# Patient Record
Sex: Female | Born: 1990 | Race: Black or African American | Hispanic: No | Marital: Single | Smoking: Never smoker
Health system: Southern US, Community
[De-identification: ages and names within clinical notes are randomized; demographics above are authoritative.]

## PROBLEM LIST (undated history)

## (undated) ENCOUNTER — Inpatient Hospital Stay: Payer: Self-pay

## (undated) DIAGNOSIS — O09299 Supervision of pregnancy with other poor reproductive or obstetric history, unspecified trimester: Secondary | ICD-10-CM

## (undated) HISTORY — PX: TONSILLECTOMY: SUR1361

## (undated) HISTORY — DX: Supervision of pregnancy with other poor reproductive or obstetric history, unspecified trimester: O09.299

---

## 2015-06-22 NOTE — L&D Delivery Note (Signed)
Deliver Note   Date of Delivery:   12/17/2015 Primary OB:   [facility] Gestational Age/EDD: 8152w5d by 01/09/2016, by Last Menstrual Period  Antepartum complications:  OB History    Gravida Para Term Preterm AB TAB SAB Ectopic Multiple Living   1               Delivered By:   Vena AustriaStaebler, Darrio Bade MD  Delivery Type:   Vaginal Anesthesia:     Epidural  Intrapartum complications:  GBS:    Unknown Laceration:    none Episiotomy:    none Placenta:    Spontaneous Estimated Blood Loss:  200mL Baby:    Non-viable  female , APGAR (1 MIN): N/A This patient has no babies on file.  APGAR (5 MINS): This patient has no babies on file.  APGAR (10 MINS): This patient has no babies on file. , weight pending   Deliver Details   At  non-viable  female was delivered via  vaginal delivery.  APGAR:N/A weight pending  Placenta status: spontaneous, intact.  Cord: 3VC, moderate amount of edematous changes to cord, with the following complications: IUFD .  Phenotypically normal appearing 3rd trimester female IUFD, some desquamation present, there was a moderate amount of edematous changes noted in the fetal cord but no knots or nuchals noted.      Mom to postpartum.  [facility].

## 2015-12-16 ENCOUNTER — Inpatient Hospital Stay
Admission: EM | Admit: 2015-12-16 | Discharge: 2015-12-18 | DRG: 775 | Disposition: A | Discharge status: Home / Self Care | Payer: 59 | Attending: Obstetrics & Gynecology | Admitting: Obstetrics & Gynecology

## 2015-12-16 DIAGNOSIS — O364XX0 Maternal care for intrauterine death, not applicable or unspecified: Principal | ICD-10-CM | POA: Diagnosis present

## 2015-12-16 DIAGNOSIS — Z3A36 36 weeks gestation of pregnancy: Secondary | ICD-10-CM | POA: Diagnosis not present

## 2015-12-16 DIAGNOSIS — IMO0002 Reserved for concepts with insufficient information to code with codable children: Secondary | ICD-10-CM | POA: Diagnosis present

## 2015-12-16 MED ORDER — ACETAMINOPHEN 325 MG PO TABS
650.0000 mg | ORAL_TABLET | ORAL | Status: DC | PRN
  Administered 2015-12-17 (×2): 650 mg via ORAL
  Filled 2015-12-16 (×2): qty 2

## 2015-12-16 MED ORDER — MISOPROSTOL 200 MCG PO TABS
200.0000 ug | ORAL_TABLET | ORAL | Status: DC
  Administered 2015-12-17: 200 ug via VAGINAL
  Filled 2015-12-16 (×3): qty 1

## 2015-12-16 MED ORDER — BUTORPHANOL TARTRATE 1 MG/ML IJ SOLN
1.0000 mg | INTRAMUSCULAR | Status: DC | PRN
  Administered 2015-12-17 (×2): 1 mg via INTRAVENOUS
  Filled 2015-12-16 (×2): qty 1

## 2015-12-16 MED ORDER — LACTATED RINGERS IV SOLN: 500.0000 mL | INTRAVENOUS | Status: DC | PRN

## 2015-12-16 MED ORDER — LACTATED RINGERS IV SOLN
INTRAVENOUS | Status: DC
  Administered 2015-12-17: 125 mL/h via INTRAVENOUS
  Administered 2015-12-17: via INTRAVENOUS
  Administered 2015-12-17: 125 mL/h via INTRAVENOUS

## 2015-12-16 MED ORDER — ONDANSETRON HCL 4 MG/2ML IJ SOLN: 4.0000 mg | Freq: Four times a day (QID) | INTRAMUSCULAR | Status: DC | PRN

## 2015-12-16 MED ORDER — OXYTOCIN BOLUS FROM INFUSION: 500.0000 mL | INTRAVENOUS | Status: DC

## 2015-12-16 MED ORDER — OXYTOCIN 40 UNITS IN LACTATED RINGERS INFUSION - SIMPLE MED
2.5000 [IU]/h | INTRAVENOUS | Status: DC
  Filled 2015-12-16 (×2): qty 1000

## 2015-12-16 NOTE — H&P (Signed)
Obstetrics Admission History & Physical   CC: Fetal demise   HPI:  25 y.o. G1P0 @ 854w4d (01/09/2016, by Last Menstrual Period). Admitted on 12/16/2015:   Presents for office visit, no FHTs.  US confirms no FHTs.  No pain, spotting recently, no other sx's, even + FM today.  [facility]  PMHx:  Past Medical History  Diagnosis Date  . Medical history non-contributory    PSHx:  Past Surgical History  Procedure Laterality Date  . Tonsillectomy     Medications:  No prescriptions prior to admission   Allergies: has No Known Allergies. OBHx:  OB History  Gravida Para Term Preterm AB SAB TAB Ectopic Multiple Living  1             # Outcome Date GA Lbr Len/2nd Weight Sex Delivery Anes PTL Lv  1 Current              XBJ:YNWGNFAO/ZHYQMVHQIONGFHx:Negative/unremarkable except as detailed in HPI. Soc Hx: Pregnancy welcomed  Objective:   Filed Vitals:   12/16/15 2100  BP: 130/70  Pulse: 100  Temp: 98.6 F (37 C)  Resp: 18   General: Well nourished, well developed female in no acute distress.  Skin: Warm and dry.  Cardiovascular:Regular rate and rhythm. Respiratory: Clear to auscultation bilateral. Normal respiratory effort Abdomen: mild Neuro/Psych: Normal mood and affect.   Pelvic exam: is not limited by body habitus EGBUS: within normal limits Vagina: within normal limits and with normal mucosa blood in the vault Cervix: closed exam Uterus: No contractions observed for 30 minutes.  Adnexa: not evaluated  EFM:FHR: None  Toco: None US at bedside: Vertex, No FHTs, Ant placenta, Adequate amniotic fluid   Assessment & Plan:   25 y.o. G1P0 @ 584w4d, Admitted on 12/16/2015:Fetal demise Admit for Induction Cytotec Pain meds Couinseled as to exam and labs and other investigations that can be done, also that many times no answer for what led to this can be found. Future fertility discussed.

## 2015-12-17 ENCOUNTER — Inpatient Hospital Stay: Payer: 59 | Admitting: Anesthesiology

## 2015-12-17 ENCOUNTER — Inpatient Hospital Stay: Payer: 59

## 2015-12-17 ENCOUNTER — Encounter: Payer: Self-pay | Admitting: Anesthesiology

## 2015-12-17 LAB — CBC WITH DIFFERENTIAL/PLATELET
BASOS ABS: 0 10*3/uL (ref 0–0.1)
Basophils Relative: 0 %
Eosinophils Absolute: 0 10*3/uL (ref 0–0.7)
Eosinophils Relative: 0 %
HEMATOCRIT: 30.8 % — AB (ref 35.0–47.0)
Hemoglobin: 10.7 g/dL — ABNORMAL LOW (ref 12.0–16.0)
Lymphs Abs: 1.1 10*3/uL (ref 1.0–3.6)
MCH: 29.7 pg (ref 26.0–34.0)
MCHC: 34.8 g/dL (ref 32.0–36.0)
MCV: 85.3 fL (ref 80.0–100.0)
Monocytes Absolute: 1.3 10*3/uL — ABNORMAL HIGH (ref 0.2–0.9)
Monocytes Relative: 13 %
NEUTROS ABS: 7.8 10*3/uL — AB (ref 1.4–6.5)
Neutrophils Relative %: 76 %
Platelets: 190 10*3/uL (ref 150–440)
RBC: 3.61 MIL/uL — AB (ref 3.80–5.20)
RDW: 13.3 % (ref 11.5–14.5)
WBC: 10.2 10*3/uL (ref 3.6–11.0)

## 2015-12-17 LAB — APTT
APTT: 32 s (ref 24–36)
aPTT: 32 seconds (ref 24–36)

## 2015-12-17 LAB — CBC
HEMATOCRIT: 31.2 % — AB (ref 35.0–47.0)
HEMOGLOBIN: 11.2 g/dL — AB (ref 12.0–16.0)
MCH: 30.4 pg (ref 26.0–34.0)
MCHC: 35.8 g/dL (ref 32.0–36.0)
MCV: 84.7 fL (ref 80.0–100.0)
PLATELETS: 208 10*3/uL (ref 150–440)
RBC: 3.69 MIL/uL — AB (ref 3.80–5.20)
RDW: 13.7 % (ref 11.5–14.5)
WBC: 10 10*3/uL (ref 3.6–11.0)

## 2015-12-17 LAB — PROTIME-INR
INR: 1.01
INR: 1.07
Prothrombin Time: 13.5 seconds (ref 11.4–15.0)
Prothrombin Time: 14.1 seconds (ref 11.4–15.0)

## 2015-12-17 LAB — KLEIHAUER-BETKE STAIN
Fetal Cells %: 4.94 %
Quantitation Fetal Hemoglobin: 0.0494 mL

## 2015-12-17 LAB — TYPE AND SCREEN
ABO/RH(D): O POS
Antibody Screen: NEGATIVE

## 2015-12-17 LAB — FIBRINOGEN: FIBRINOGEN: 484 mg/dL — AB (ref 210–470)

## 2015-12-17 LAB — ANTITHROMBIN III: ANTITHROMB III FUNC: 91 % (ref 75–120)

## 2015-12-17 MED ORDER — DIPHENHYDRAMINE HCL 50 MG/ML IJ SOLN: 12.5000 mg | INTRAMUSCULAR | Status: DC | PRN

## 2015-12-17 MED ORDER — MISOPROSTOL 25 MCG QUARTER TABLET: 25.0000 ug | ORAL_TABLET | ORAL | Status: DC

## 2015-12-17 MED ORDER — LIDOCAINE HCL (PF) 1 % IJ SOLN
INTRAMUSCULAR | Status: DC | PRN
  Administered 2015-12-17: 3 mL

## 2015-12-17 MED ORDER — BUPIVACAINE HCL (PF) 0.25 % IJ SOLN
INTRAMUSCULAR | Status: DC | PRN
  Administered 2015-12-17 (×2): 3 mL via EPIDURAL

## 2015-12-17 MED ORDER — DIPHENHYDRAMINE HCL 25 MG PO CAPS: 25.0000 mg | ORAL_CAPSULE | Freq: Four times a day (QID) | ORAL | Status: DC | PRN

## 2015-12-17 MED ORDER — ONDANSETRON HCL 4 MG PO TABS: 4.0000 mg | ORAL_TABLET | ORAL | Status: DC | PRN

## 2015-12-17 MED ORDER — PHENYLEPHRINE 40 MCG/ML (10ML) SYRINGE FOR IV PUSH (FOR BLOOD PRESSURE SUPPORT): 80.0000 ug | PREFILLED_SYRINGE | INTRAVENOUS | Status: DC | PRN

## 2015-12-17 MED ORDER — ACETAMINOPHEN 325 MG PO TABS: 650.0000 mg | ORAL_TABLET | ORAL | Status: DC | PRN

## 2015-12-17 MED ORDER — FENTANYL 2.5 MCG/ML W/ROPIVACAINE 0.2% IN NS 100 ML EPIDURAL INFUSION (ARMC-ANES)
EPIDURAL | Status: AC
  Administered 2015-12-17: 10 mL/h via EPIDURAL
  Filled 2015-12-17: qty 100

## 2015-12-17 MED ORDER — TERBUTALINE SULFATE 1 MG/ML IJ SOLN
0.2500 mg | Freq: Once | INTRAMUSCULAR | Status: AC
  Administered 2015-12-17: 0.25 mg via SUBCUTANEOUS

## 2015-12-17 MED ORDER — COCONUT OIL OIL
1.0000 "application " | TOPICAL_OIL | Status: DC | PRN
  Filled 2015-12-17: qty 120

## 2015-12-17 MED ORDER — DIBUCAINE 1 % RE OINT: 1.0000 "application " | TOPICAL_OINTMENT | RECTAL | Status: DC | PRN

## 2015-12-17 MED ORDER — SODIUM CHLORIDE 0.9 % IJ SOLN
INTRAMUSCULAR | Status: AC
  Filled 2015-12-17: qty 50

## 2015-12-17 MED ORDER — ONDANSETRON HCL 4 MG/2ML IJ SOLN: 4.0000 mg | INTRAMUSCULAR | Status: DC | PRN

## 2015-12-17 MED ORDER — SENNOSIDES-DOCUSATE SODIUM 8.6-50 MG PO TABS: 2.0000 | ORAL_TABLET | ORAL | Status: DC

## 2015-12-17 MED ORDER — EPHEDRINE 5 MG/ML INJ: 10.0000 mg | INTRAVENOUS | Status: DC | PRN

## 2015-12-17 MED ORDER — LACTATED RINGERS IV SOLN: 500.0000 mL | Freq: Once | INTRAVENOUS | Status: DC

## 2015-12-17 MED ORDER — WITCH HAZEL-GLYCERIN EX PADS: 1.0000 "application " | MEDICATED_PAD | CUTANEOUS | Status: DC | PRN

## 2015-12-17 MED ORDER — FENTANYL 2.5 MCG/ML W/ROPIVACAINE 0.2% IN NS 100 ML EPIDURAL INFUSION (ARMC-ANES)
EPIDURAL | Status: AC
  Filled 2015-12-17: qty 100

## 2015-12-17 MED ORDER — PRENATAL MULTIVITAMIN CH: 1.0000 | ORAL_TABLET | Freq: Every day | ORAL | Status: DC

## 2015-12-17 MED ORDER — BENZOCAINE-MENTHOL 20-0.5 % EX AERO: 1.0000 "application " | INHALATION_SPRAY | CUTANEOUS | Status: DC | PRN

## 2015-12-17 MED ORDER — FENTANYL 2.5 MCG/ML W/ROPIVACAINE 0.2% IN NS 100 ML EPIDURAL INFUSION (ARMC-ANES): 10.0000 mL/h | EPIDURAL | Status: DC

## 2015-12-17 MED ORDER — TERBUTALINE SULFATE 1 MG/ML IJ SOLN
INTRAMUSCULAR | Status: AC
  Administered 2015-12-17: 0.25 mg via SUBCUTANEOUS
  Filled 2015-12-17: qty 1

## 2015-12-17 MED ORDER — LIDOCAINE-EPINEPHRINE (PF) 1.5 %-1:200000 IJ SOLN
INTRAMUSCULAR | Status: DC | PRN
  Administered 2015-12-17: 3 mL via PERINEURAL

## 2015-12-17 MED ORDER — OXYCODONE-ACETAMINOPHEN 5-325 MG PO TABS: 1.0000 | ORAL_TABLET | ORAL | Status: DC | PRN

## 2015-12-17 MED ORDER — IBUPROFEN 600 MG PO TABS
600.0000 mg | ORAL_TABLET | Freq: Four times a day (QID) | ORAL | Status: DC
  Administered 2015-12-17 – 2015-12-18 (×3): 600 mg via ORAL
  Filled 2015-12-17 (×3): qty 1

## 2015-12-17 MED ORDER — MISOPROSTOL 25 MCG QUARTER TABLET
ORAL_TABLET | ORAL | Status: AC
  Filled 2015-12-17: qty 0.25

## 2015-12-17 MED ORDER — SIMETHICONE 80 MG PO CHEW: 80.0000 mg | CHEWABLE_TABLET | ORAL | Status: DC | PRN

## 2015-12-17 MED ORDER — OXYCODONE-ACETAMINOPHEN 5-325 MG PO TABS: 2.0000 | ORAL_TABLET | ORAL | Status: DC | PRN

## 2015-12-17 MED ORDER — MISOPROSTOL 200 MCG PO TABS
ORAL_TABLET | ORAL | Status: AC
  Filled 2015-12-17: qty 1

## 2015-12-17 NOTE — Progress Notes (Signed)
[facility] offered and advised available if requested. Patient's Renato Gailsastor ministering to patient.

## 2015-12-17 NOTE — Anesthesia Preprocedure Evaluation (Addendum)
Anesthesia Evaluation  Patient identified by MRN, date of birth, ID band Patient awake    Reviewed: Allergy & Precautions, NPO status , Patient's Chart, lab work & pertinent test results, reviewed documented beta blocker date and time   History of Anesthesia Complications Negative for: history of anesthetic complications  Airway Mallampati: II  TM Distance: >3 FB Neck ROM: full    Dental no notable dental hx. (+) Chipped   Pulmonary neg pulmonary ROS,    Pulmonary exam normal        Cardiovascular negative cardio ROS Normal cardiovascular exam     Neuro/Psych negative neurological ROS  negative psych ROS   GI/Hepatic negative GI ROS, Neg liver ROS,   Endo/Other  negative endocrine ROS  Renal/GU negative Renal ROS  negative genitourinary   Musculoskeletal   Abdominal   Peds  Hematology negative hematology ROS (+)   Anesthesia Other Findings   Reproductive/Obstetrics (+) Pregnancy                            Anesthesia Physical Anesthesia Plan  ASA: II  Anesthesia Plan: Epidural   Post-op Pain Management:    Induction:   Airway Management Planned:   Additional Equipment:   Intra-op Plan:   Post-operative Plan:   Informed Consent: I have reviewed the patients History and Physical, chart, labs and discussed the procedure including the risks, benefits and alternatives for the proposed anesthesia with the patient or authorized representative who has indicated his/her understanding and acceptance.     Plan Discussed with: CRNA and Anesthesiologist  Anesthesia Plan Comments:        Anesthesia Quick Evaluation

## 2015-12-17 NOTE — Progress Notes (Signed)
Pt. Ready to discuss POC.  Pt. Moved to LDR#6 for privacy - away from laboring mothers. [facility].  Held 0900 dose of cyctotec, pt. in pain and requesting epidural.

## 2015-12-17 NOTE — Progress Notes (Signed)
Received update from RN regarding pt's temperature of 103.0 F. Dose of cytotec held due to tachysystole. Tylenol 650 mg ordered. RN states pt is feeling comfortable and not feverish.   Tresea MallGLEDHILL,Lauralie Blacksher, CNM

## 2015-12-17 NOTE — Progress Notes (Signed)
Pt is complete and feeling pressure to push. RN will begin pushing with her. Specimen collection tools ready for delivery for Karyotyping and infectious processes.  Tresea MallGledhill, Jerrian Mells, CNM

## 2015-12-17 NOTE — Anesthesia Procedure Notes (Signed)
Epidural Patient location during procedure: floor Start time: 12/17/2015 9:40 AM End time: 12/17/2015 10:17 AM  Staffing Resident/CRNA: Dionne Rossa Performed by: resident/CRNA   Preanesthetic Checklist Completed: patient identified, site marked, surgical consent, pre-op evaluation, timeout performed, IV checked, risks and benefits discussed and monitors and equipment checked  Epidural Patient position: sitting Prep: Betadine Patient monitoring: heart rate, continuous pulse ox and blood pressure Approach: midline Location: L3-L4 Injection technique: LOR air  Needle:  Needle type: Tuohy  Needle gauge: 17 G Needle length: 9 cm and 9 Catheter type: closed end flexible Catheter size: 20 Guage Test dose: negative and 1.5% lidocaine with Epi 1:200 K  Assessment Sensory level: T10 Events: blood not aspirated, injection not painful, no injection resistance, negative IV test and no paresthesia  Additional Notes X 3 sticks.  Possible dural puncture with negative test.  Patients VSS.  Epi cath placed above possible dural puncture.  Easy placement of catheter.  Fent/ropi drip placed and set to 2710ml/hr.  Pt comfortable. No complaints  Patient tolerated the insertion well without complications.Reason for block:procedure for pain

## 2015-12-17 NOTE — Progress Notes (Signed)
RN to the bedside to express condolences and discuss POC.  Pt. Alert and verbalizing desire for pain medication. Pt. Encouraged to call nurse when ready to proceed with induction.  Pt. Still processing situation (36.5 IUFD) and will call out when ready to proceed. VSS.

## 2015-12-17 NOTE — Progress Notes (Signed)
Subjective:  Doing well no concerns  Objective:   Vitals: Blood pressure 104/56, pulse 99, temperature 100.1 F (37.8 C), temperature source Oral, resp. rate 18, height 5\' 5"  (1.651 m), weight 77.565 kg (171 lb), last menstrual period 04/04/2015. General:  Abdomen: Cervical Exam:  Dilation: 4 Effacement (%): 70 Cervical Position: Anterior Station: -2 Exam by:: Millner, RN  FHT:  N/A Toco: q621min  Results for orders placed or performed during the hospital encounter of 12/16/15 (from the past 24 hour(s))  Antithrombin III     Status: None   Collection Time: 12/16/15 11:36 PM  Result Value Ref Range   AntiThromb III Func 91 75 - 120 %  APTT     Status: None   Collection Time: 12/16/15 11:36 PM  Result Value Ref Range   aPTT 32 24 - 36 seconds  Kleihauer-Betke stain     Status: None   Collection Time: 12/16/15 11:36 PM  Result Value Ref Range   Fetal Cells % 4.94 %   Quantitation Fetal Hemoglobin 0.0494 mL   # Vials RhIg NOT INDICATED   Protime-INR     Status: None   Collection Time: 12/16/15 11:36 PM  Result Value Ref Range   Prothrombin Time 13.5 11.4 - 15.0 seconds   INR 1.01   CBC     Status: Abnormal   Collection Time: 12/16/15 11:36 PM  Result Value Ref Range   WBC 10.0 3.6 - 11.0 K/uL   RBC 3.69 (L) 3.80 - 5.20 MIL/uL   Hemoglobin 11.2 (L) 12.0 - 16.0 g/dL   HCT 40.931.2 (L) 81.135.0 - 91.447.0 %   MCV 84.7 80.0 - 100.0 fL   MCH 30.4 26.0 - 34.0 pg   MCHC 35.8 32.0 - 36.0 g/dL   RDW 78.213.7 95.611.5 - 21.314.5 %   Platelets 208 150 - 440 K/uL  Type and screen [facility]     Status: None   Collection Time: 12/16/15 11:36 PM  Result Value Ref Range   ABO/RH(D) O POS    Antibody Screen NEG    Sample Expiration 12/19/2015     Assessment:   25 y.o. G1P0 6116w5d IUFD  Plan:   1) Labor - AROM performed, clear fluid, IUPC placed.  KB is positive from last night corresponding to 150-16260mL of fetal blood so abruption is in the differential  2) Fetus - patient  does want cytogenetics still undecided on autopsy.

## 2015-12-17 NOTE — Progress Notes (Signed)
Subjective:  Doing well,. Comfortable with epidural  Objective:   Vitals: Blood pressure 117/94, pulse 115, temperature 98.2 F (36.8 C), temperature source Oral, resp. rate 16, height 5\' 5"  (1.651 m), weight 77.565 kg (171 lb), last menstrual period 04/04/2015. General:  NAD Abdomen: Gravid, does not appear to have any uterine relaxation in between contractions Cervical Exam:  Dilation: 1 Effacement (%): 30, 40 Cervical Position: Anterior Station: -2 Exam by:: Millner, RN   FHT: N/A Toco: tachysystole  Results for orders placed or performed during the hospital encounter of 12/16/15 (from the past 24 hour(s))  Antithrombin III     Status: None   Collection Time: 12/16/15 11:36 PM  Result Value Ref Range   AntiThromb III Func 91 75 - 120 %  APTT     Status: None   Collection Time: 12/16/15 11:36 PM  Result Value Ref Range   aPTT 32 24 - 36 seconds  Kleihauer-Betke stain     Status: None   Collection Time: 12/16/15 11:36 PM  Result Value Ref Range   Fetal Cells % 4.94 %   Quantitation Fetal Hemoglobin 0.0494 mL   # Vials RhIg NOT INDICATED   Protime-INR     Status: None   Collection Time: 12/16/15 11:36 PM  Result Value Ref Range   Prothrombin Time 13.5 11.4 - 15.0 seconds   INR 1.01   CBC     Status: Abnormal   Collection Time: 12/16/15 11:36 PM  Result Value Ref Range   WBC 10.0 3.6 - 11.0 K/uL   RBC 3.69 (L) 3.80 - 5.20 MIL/uL   Hemoglobin 11.2 (L) 12.0 - 16.0 g/dL   HCT 40.931.2 (L) 81.135.0 - 91.447.0 %   MCV 84.7 80.0 - 100.0 fL   MCH 30.4 26.0 - 34.0 pg   MCHC 35.8 32.0 - 36.0 g/dL   RDW 78.213.7 95.611.5 - 21.314.5 %   Platelets 208 150 - 440 K/uL  Type and screen [facility]     Status: None (Preliminary result)   Collection Time: 12/16/15 11:36 PM  Result Value Ref Range   ABO/RH(D) O POS    Antibody Screen NEG    Sample Expiration 12/19/2015     Assessment:   25 y.o. G1P0 4332w5d with IUFD  Plan:   1) Labor - cervical  foley catheter placed, hold  cytotec adminsiter terbutaline x 1.  Decrease cytotec to 25mcg given near-term/term IUFD

## 2015-12-17 NOTE — Discharge Summary (Signed)
Obstetric Discharge Summary Reason for Admission: induction of labor and IUFD Prenatal Procedures: none Intrapartum Procedures: spontaneous vaginal delivery Postpartum Procedures: none Complications-Operative and Postpartum:none  HEMOGLOBIN  Date Value Ref Range Status  12/17/2015 10.7* 12.0 - 16.0 g/dL Final   HCT  Date Value Ref Range Status  12/17/2015 30.8* 35.0 - 47.0 % Final   Postpartum Hct: 30.9  Physical Exam:  General: alert, appears stated age and no distress Lochia: appropriate Uterine Fundus: firm DVT Evaluation: No evidence of DVT seen on physical exam.  Discharge Diagnoses: Term Pregnancy-delivered  Discharge Information: Date: 12/17/2015 Activity: pelvic rest Diet: routine Meds:    Medication List    TAKE these medications        ibuprofen 600 MG tablet  Commonly known as:  ADVIL,MOTRIN  Take 1 tablet (600 mg total) by mouth every 6 (six) hours.     prenatal multivitamin Tabs tablet  Take 1 tablet by mouth daily at 12 noon.        Condition: stable Discharge to: home Follow-up Information    Follow up with Lorrene ReidSTAEBLER, ANDREAS M, MD In 1 week.   Specialty:  Obstetrics and Gynecology   Why:  postpartum check up IUFD   Contact information:   [address] [place] [address] [phone]       Lorrene ReidSTAEBLER, ANDREAS M 12/17/2015, 10:21 PM   Marta Antuamara Keyandre Pileggi, CNM

## 2015-12-18 LAB — CBC
HEMATOCRIT: 30.9 % — AB (ref 35.0–47.0)
HEMOGLOBIN: 10.6 g/dL — AB (ref 12.0–16.0)
MCH: 29.7 pg (ref 26.0–34.0)
MCHC: 34.5 g/dL (ref 32.0–36.0)
MCV: 86.2 fL (ref 80.0–100.0)
Platelets: 188 10*3/uL (ref 150–440)
RBC: 3.58 MIL/uL — AB (ref 3.80–5.20)
RDW: 13.4 % (ref 11.5–14.5)
WBC: 16.8 10*3/uL — ABNORMAL HIGH (ref 3.6–11.0)

## 2015-12-18 MED ORDER — IBUPROFEN 600 MG PO TABS: 600.0000 mg | ORAL_TABLET | Freq: Four times a day (QID) | ORAL | Status: DC

## 2015-12-18 NOTE — Progress Notes (Signed)
Nursing supervisor to the unit to delivery infant-demise to the morgue.

## 2015-12-18 NOTE — Discharge Instructions (Signed)
Discharge instructions:  ° °Call office if you have any of the following: headache, visual changes, fever >100 F, chills, breast concerns, excessive vaginal bleeding, incision drainage or problems, leg pain or redness, depression or any other concerns.  ° °Activity: Do not lift > 10 lbs for 6 weeks.  °No intercourse or tampons for 6 weeks.  °No driving for 1-2 weeks.  ° °

## 2015-12-18 NOTE — Progress Notes (Signed)
RN to the bedside for "beeping pump". Oxytocin with LR (postpartum infusing) completed.  Saline lock IV. Pt. Ready to receive  "Lauryn" before taking to the morgue.

## 2015-12-18 NOTE — Progress Notes (Signed)
RN to the bedside to review discharge instructions with patient.  "Peri-loss" memory box reviewed with both mother and father, CD of baby pictures given, support group discussed with pt. And FOB. Copy of discharge instructions signed and given. Questions asked and answered. Prescription along with instructions given.  Pt. Discharged to home with FOB and family members at the bedside.

## 2015-12-19 LAB — TORCH-IGM(TOXO/ RUB/ CMV/ HSV) W TITER
CMV IgM: <30 AU/mL (ref 0.0–29.9)
HSVI/II Comb IgM: 1.25 Ratio — ABNORMAL HIGH (ref 0.00–0.90)
Toxoplasma Antibody- IgM: <3 AU/mL (ref 0.0–7.9)

## 2015-12-19 LAB — RPR: RPR: NONREACTIVE

## 2015-12-19 LAB — PROTEIN S, TOTAL: Protein S Ag, Total: 77 % (ref 60–150)

## 2015-12-19 LAB — LUPUS ANTICOAGULANT PANEL
DRVVT: 32 s (ref 0.0–47.0)
PTT LA: 39.7 s (ref 0.0–51.9)

## 2015-12-19 LAB — SURGICAL PATHOLOGY

## 2015-12-19 LAB — PROTEIN C, TOTAL: PROTEIN C, TOTAL: 79 % (ref 60–150)

## 2015-12-19 LAB — INFECT DISEASE AB IGM REFLEX 1

## 2015-12-19 NOTE — Anesthesia Postprocedure Evaluation (Signed)
Anesthesia Post Note  Patient: Cathy [name]  Procedure(s) Performed: * No procedures listed *  Patient location during evaluation: Other Anesthesia Type: General Level of consciousness: awake and alert Pain management: pain level controlled Vital Signs Assessment: post-procedure vital signs reviewed and stable Respiratory status: spontaneous breathing, nonlabored ventilation, respiratory function stable and patient connected to nasal cannula oxygen Cardiovascular status: blood pressure returned to baseline and stable Postop Assessment: no signs of nausea or vomiting Anesthetic complications: no    Last Vitals: There were no vitals filed for this visit.  Last Pain: There were no vitals filed for this visit.               Clairessa Boulet S

## 2015-12-22 LAB — LUPUS ANTICOAGULANT PANEL
DRVVT: 31.5 s (ref 0.0–47.0)
PTT Lupus Anticoagulant: 40.5 s (ref 0.0–51.9)

## 2015-12-22 LAB — PARVOVIRUS B19 ANTIBODY, IGG AND IGM
PAROVIRUS B19 IGG ABS: 0.6 {index} (ref 0.0–0.8)
PAROVIRUS B19 IGM ABS: 0.4 {index} (ref 0.0–0.8)

## 2015-12-22 LAB — CARDIOLIPIN ANTIBODIES, IGG, IGM, IGA
Anticardiolipin IgA: <9 APL U/mL (ref 0–11)
Anticardiolipin IgG: <9 GPL U/mL (ref 0–14)
Anticardiolipin IgM: <9 MPL U/mL (ref 0–12)

## 2015-12-23 LAB — AEROBIC/ANAEROBIC CULTURE W GRAM STAIN (SURGICAL/DEEP WOUND)

## 2015-12-23 LAB — AEROBIC/ANAEROBIC CULTURE (SURGICAL/DEEP WOUND)

## 2015-12-25 LAB — AEROBIC/ANAEROBIC CULTURE W GRAM STAIN (SURGICAL/DEEP WOUND): Culture: NORMAL

## 2015-12-25 LAB — AEROBIC/ANAEROBIC CULTURE (SURGICAL/DEEP WOUND)

## 2015-12-29 LAB — POC/TISSUE MICROARRAY

## 2016-10-20 ENCOUNTER — Encounter (HOSPITAL_COMMUNITY): Payer: Self-pay

## 2017-05-16 ENCOUNTER — Encounter: Payer: Self-pay | Admitting: Obstetrics and Gynecology

## 2017-05-16 ENCOUNTER — Ambulatory Visit (INDEPENDENT_AMBULATORY_CARE_PROVIDER_SITE_OTHER): Payer: Commercial Managed Care - PPO | Admitting: Obstetrics and Gynecology

## 2017-05-16 VITALS — BP 132/68 | Wt 140.0 lb

## 2017-05-16 DIAGNOSIS — O0990 Supervision of high risk pregnancy, unspecified, unspecified trimester: Secondary | ICD-10-CM

## 2017-05-16 DIAGNOSIS — Z3A01 Less than 8 weeks gestation of pregnancy: Secondary | ICD-10-CM

## 2017-05-16 DIAGNOSIS — N912 Amenorrhea, unspecified: Secondary | ICD-10-CM

## 2017-05-16 DIAGNOSIS — Z8759 Personal history of other complications of pregnancy, childbirth and the puerperium: Secondary | ICD-10-CM

## 2017-05-16 LAB — POCT URINE PREGNANCY: PREG TEST UR: POSITIVE — AB

## 2017-05-16 NOTE — Progress Notes (Signed)
NOB History of stilborn @ 36 weeks

## 2017-05-16 NOTE — Progress Notes (Signed)
New Obstetric Patient H&P    Chief Complaint: "Desires prenatal care"   History of Present Illness: Patient is a 26 y.o. G2P0100 Not Hispanic or Latino female, Patient's last menstrual period was 03/31/2017. presents with amenorrhea and positive home pregnancy test. Based on her  LMP, her EDD is Estimated Date of Delivery: 01/05/18 and her EGA is 10940w4d. Cycles are regular monthly occur every 28 days. Her last pap smear was normal 08/12/15 and normal.  She had a urine pregnancy test which was positive 1 week(s)  ago. Her last menstrual period was normal and lasted for 4 days.  Past medical history is noncontributory. Her prior pregnancies are notable for 36 week intrauterine fetal demise, normal anatomy, normal hypercoaguable work up and TORCH titers, normal fetal microarray.  Since her LMP, she admits to the use of tobacco products  no She claims she has gained   no pounds since the start of her pregnancy.  There are cats in the home in the home  no If yes N/A She admits close contact with children on a regular basis  no  She has had chicken pox in the past yes She has had Tuberculosis exposures, symptoms, or previously tested positive for TB   no Current or past history of domestic violence. no  Genetic Screening/Teratology Counseling: (Includes patient, baby's father, or anyone in either family with:)   1. [facility]  no 2. Thalassemia ([place], [place], Mediterranean, or Asian background): MCV<80  no 3. Neural tube defect (meningomyelocele, spina bifida, anencephaly)  no 4. Congenital heart defect  no  5. Down syndrome  no 6. Tay-Sachs (Jewish, [place])  no 7. Canavan's Disease  no 8. Sickle cell disease or trait (African)  no  9. Hemophilia or other blood disorders  no  10. Muscular dystrophy  no  11. Cystic fibrosis  no  12. Huntington's Chorea  no  13. Mental retardation/autism  no 14. Other inherited genetic or chromosomal disorder  no 15. Maternal  metabolic disorder (DM, PKU, etc)  no 16. Patient or FOB with a child with a birth defect not listed above no  16a. Patient or FOB with a birth defect themselves no 17. Recurrent pregnancy loss, or stillbirth  yes IUFD as detailed above 18. Any medications since LMP other than prenatal vitamins (include vitamins, supplements, OTC meds, drugs, alcohol)  no 19. Any other genetic/environmental exposure to discuss  no  Infection History:   1. Lives with someone with TB or TB exposed  no  2. Patient or partner has history of genital herpes  no 3. Rash or viral illness since LMP  no 4. History of STI (GC, CT, HPV, syphilis, HIV)  no 5. History of recent travel :  no  Other pertinent information:  no     Review of Systems:10 point review of systems negative unless otherwise noted in HPI  Past Medical History:  Past Medical History:  Diagnosis Date  . Medical history non-contributory     Past Surgical History:  Past Surgical History:  Procedure Laterality Date  . TONSILLECTOMY      Gynecologic History: Patient's last menstrual period was 03/31/2017.  Obstetric History: G2P0100  Family History:  History reviewed. No pertinent family history.  Social History:  Social History   Socioeconomic History  . Marital status: Single    Spouse name: Not on file  . Number of children: Not on file  . Years of education: Not on file  . Highest  education level: Not on file  Social Needs  . Financial resource strain: Not on file  . Food insecurity - worry: Not on file  . Food insecurity - inability: Not on file  . Transportation needs - medical: Not on file  . Transportation needs - non-medical: Not on file  Occupational History  . Not on file  Tobacco Use  . Smoking status: Never Smoker  . Smokeless tobacco: Never Used  Substance and Sexual Activity  . Alcohol use: No  . Drug use: No  . Sexual activity: Yes  Other Topics Concern  . Not on file  Social History Narrative  .  Not on file    Allergies:  No Known Allergies  Medications: Prior to Admission medications   Medication Sig Start Date End Date Taking? Authorizing Provider  ibuprofen (ADVIL,MOTRIN) 600 MG tablet Take 1 tablet (600 mg total) by mouth every 6 (six) hours. 12/18/15   Marta AntuBrothers, Tamara, CNM  Prenatal Vit-Fe Fumarate-FA (PRENATAL MULTIVITAMIN) TABS tablet Take 1 tablet by mouth daily at 12 noon.    [provider]    Physical Exam Vitals: Last menstrual period 03/31/2017, unknown if currently breastfeeding. Patient's last menstrual period was 03/31/2017.  General: NAD HEENT: normocephalic, anicteric Thyroid: no enlargement, no palpable nodules Pulmonary: No increased work of breathing, CTAB Cardiovascular: RRR, distal pulses 2+ Abdomen: NABS, soft, non-tender, non-distended.  Umbilicus without lesions.  No hepatomegaly, splenomegaly or masses palpable. No evidence of hernia  Genitourinary:  External: Normal external female genitalia.  Normal urethral meatus, normal  Bartholin's and Skene's glands.    Vagina: Normal vaginal mucosa, no evidence of prolapse.    Cervix: Grossly normal in appearance, no bleeding  Uterus: Non-enlarged, mobile, normal contour.  No CMT  Adnexa: ovaries non-enlarged, no adnexal masses  Rectal: deferred Extremities: no edema, erythema, or tenderness Neurologic: Grossly intact Psychiatric: mood appropriate, affect full   Assessment: 26 y.o. G2P0100 at 6371w4d presenting to initiate prenatal care  Plan: 1) Avoid alcoholic beverages. 2) Patient encouraged not to smoke.  3) Discontinue the use of all non-medicinal drugs and chemicals.  4) Take prenatal vitamins daily.  5) Nutrition, food safety (fish, cheese advisories, and high nitrite foods) and exercise discussed. 6) Hospital and practice style discussed with cross coverage system.  7) Genetic Screening, such as with 1st Trimester Screening, cell free fetal DNA, AFP testing, and Ultrasound, as well  as with amniocentesis and CVS as appropriate, is discussed with patient. At the conclusion of today's visit patient requested genetic testing 8) Patient is asked about travel to areas at risk for the Zika virus, and counseled to avoid travel and exposure to mosquitoes or sexual partners who may have themselves been exposed to the virus. Testing is discussed, and will be ordered as appropriate.

## 2017-05-17 DIAGNOSIS — Z8759 Personal history of other complications of pregnancy, childbirth and the puerperium: Secondary | ICD-10-CM | POA: Insufficient documentation

## 2017-05-17 LAB — RPR+RH+ABO+RUB AB+AB SCR+CB...
Antibody Screen: NEGATIVE
HEMOGLOBIN: 12.1 g/dL (ref 11.1–15.9)
HEP B S AG: NEGATIVE
HIV SCREEN 4TH GENERATION: NONREACTIVE
Hematocrit: 36.9 % (ref 34.0–46.6)
MCH: 28.2 pg (ref 26.6–33.0)
MCHC: 32.8 g/dL (ref 31.5–35.7)
MCV: 86 fL (ref 79–97)
PLATELETS: 288 10*3/uL (ref 150–379)
RBC: 4.29 x10E6/uL (ref 3.77–5.28)
RDW: 13.1 % (ref 12.3–15.4)
RPR: NONREACTIVE
RUBELLA: 1.2 {index} (ref >0.99)
Rh Factor: POSITIVE
VARICELLA: 1281 {index} (ref >165)
WBC: 6 10*3/uL (ref 3.4–10.8)

## 2017-05-18 LAB — URINE CULTURE: Organism ID, Bacteria: NO GROWTH

## 2017-05-18 LAB — GC/CHLAMYDIA PROBE AMP
CHLAMYDIA, DNA PROBE: NEGATIVE
Neisseria gonorrhoeae by PCR: NEGATIVE

## 2017-05-23 ENCOUNTER — Encounter: Payer: Commercial Managed Care - PPO | Admitting: Certified Nurse Midwife

## 2017-05-23 ENCOUNTER — Other Ambulatory Visit: Payer: Commercial Managed Care - PPO

## 2017-05-26 ENCOUNTER — Ambulatory Visit (INDEPENDENT_AMBULATORY_CARE_PROVIDER_SITE_OTHER): Payer: Commercial Managed Care - PPO

## 2017-05-26 DIAGNOSIS — Z3A01 Less than 8 weeks gestation of pregnancy: Secondary | ICD-10-CM | POA: Diagnosis not present

## 2017-05-26 DIAGNOSIS — N912 Amenorrhea, unspecified: Secondary | ICD-10-CM | POA: Diagnosis not present

## 2017-05-26 DIAGNOSIS — O0990 Supervision of high risk pregnancy, unspecified, unspecified trimester: Secondary | ICD-10-CM

## 2017-05-27 ENCOUNTER — Ambulatory Visit (INDEPENDENT_AMBULATORY_CARE_PROVIDER_SITE_OTHER): Payer: Commercial Managed Care - PPO | Admitting: Advanced Practice Midwife

## 2017-05-27 ENCOUNTER — Encounter: Payer: Self-pay | Admitting: Advanced Practice Midwife

## 2017-05-27 VITALS — BP 120/62 | Wt 139.0 lb

## 2017-05-27 DIAGNOSIS — Z3A08 8 weeks gestation of pregnancy: Secondary | ICD-10-CM

## 2017-05-27 DIAGNOSIS — O0990 Supervision of high risk pregnancy, unspecified, unspecified trimester: Secondary | ICD-10-CM

## 2017-05-27 NOTE — Progress Notes (Signed)
Routine Prenatal Care Visit  Subjective  Marykathryn Lalla BrothersLambert is a 26 y.o. G2P0100 at 4942w1d being seen today for ongoing prenatal care.  She is currently monitored for the following issues for this high-risk pregnancy and has Supervision of high risk pregnancy, antepartum and History of intrauterine fetal death in previous pregnancy on their problem list.  ----------------------------------------------------------------------------------- Patient reports heartburn.   Denies contractions. Denies vaginal bleeding. Denies leaking of fluid.  ----------------------------------------------------------------------------------- The following portions of the patient's history were reviewed and updated as appropriate: allergies, current medications, past family history, past medical history, past social history, past surgical history and problem list. Problem list updated.   Objective  Blood pressure 120/62, weight 139 lb (63 kg), last menstrual period 03/31/2017, unknown if currently breastfeeding. Pregravid weight Pregravid weight not on file Total Weight Gain Not found. Urinalysis:      Fetal Status: Fetal Heart Rate (bpm): 171         Dating scan yesterday agrees with LMP within 3 days. No adjustment of EDD.   General:  Alert, oriented and cooperative. Patient is in no acute distress.  Skin: Skin is warm and dry. No rash noted.   Cardiovascular: Normal heart rate noted  Respiratory: Normal respiratory effort, no problems with respiration noted  Abdomen: Soft, gravid, appropriate for gestational age.       Pelvic:  Cervical exam deferred        Extremities: Normal range of motion.     Mental Status: Normal mood and affect. Normal behavior. Normal judgment and thought content.   Assessment   26 y.o. G2P0100 at 5942w1d by  01/05/2018, by Last Menstrual Period presenting for routine prenatal visit  Plan   Pregnancy#2 Problems (from 03/31/17 to present)    Problem Noted Resolved   History of  intrauterine fetal death in previous pregnancy 05/17/2017 by Vena AustriaStaebler, Andreas, MD No   Overview Signed 05/17/2017  9:44 AM by Vena AustriaStaebler, Andreas, MD    Negative microarray, Torch titers other than HSV, and hypercoagulable work up      Supervision of high risk pregnancy, antepartum 05/16/2017 by Vena AustriaStaebler, Andreas, MD No   Overview Addendum 05/27/2017  4:27 PM by Tresea MallGledhill, Noma Quijas, CNM    [facility]inic [facility] Prenatal Labs  Dating LMP=8wk u/s Blood type:     Genetic Screen 1 Screen:    AFP:     Quad:     NIPS: Antibody:   Anatomic US  Rubella:   Varicella: @VZVIGG @  GTT Early:               Third trimester:  RPR:     Rhogam  HBsAg:     TDaP vaccine                       Flu Shot: HIV:     Baby Food                                GBS:   Contraception  Pap:  CBB     CS/VBAC    Support Person Jamal               Preterm labor symptoms and general obstetric precautions including but not limited to vaginal bleeding, contractions, leaking of fluid and fetal movement were reviewed in detail with the patient. TUMS for heartburn  Return in about 4 weeks (around 06/24/2017) for NT scan, 1st tri screen, rob.  Tresea MallJane Cypress Hinkson, CNM  05/27/2017 4:29 PM

## 2017-05-27 NOTE — Progress Notes (Signed)
No complaints

## 2017-06-20 ENCOUNTER — Ambulatory Visit: Payer: Commercial Managed Care - PPO

## 2017-06-20 ENCOUNTER — Ambulatory Visit (INDEPENDENT_AMBULATORY_CARE_PROVIDER_SITE_OTHER): Payer: Commercial Managed Care - PPO

## 2017-06-20 ENCOUNTER — Ambulatory Visit (INDEPENDENT_AMBULATORY_CARE_PROVIDER_SITE_OTHER): Payer: Commercial Managed Care - PPO | Admitting: Obstetrics & Gynecology

## 2017-06-20 VITALS — BP 120/80 | Wt 144.0 lb

## 2017-06-20 DIAGNOSIS — O0990 Supervision of high risk pregnancy, unspecified, unspecified trimester: Secondary | ICD-10-CM

## 2017-06-20 DIAGNOSIS — Z3A11 11 weeks gestation of pregnancy: Secondary | ICD-10-CM

## 2017-06-20 DIAGNOSIS — Z362 Encounter for other antenatal screening follow-up: Secondary | ICD-10-CM | POA: Diagnosis not present

## 2017-06-20 DIAGNOSIS — Z8759 Personal history of other complications of pregnancy, childbirth and the puerperium: Secondary | ICD-10-CM

## 2017-06-20 NOTE — Patient Instructions (Signed)

## 2017-06-20 NOTE — Progress Notes (Signed)
  Subjective  Fetal Movement? no Contractions? no Leaking Fluid? no Vaginal Bleeding? no  Objective  BP 120/80   Wt 144 lb (65.3 kg)   LMP 03/31/2017   BMI 23.96 kg/m  General: NAD Pumonary: no increased work of breathing Abdomen: gravid, non-tender Extremities: no edema Psychiatric: mood appropriate, affect full Review of ULTRASOUND.    I have personally reviewed images and [facility]    Plan of management to be discussed with patient.  Assessment  26 y.o. G2P0100 at 6657w4d by  01/05/2018, by Last Menstrual Period presenting for routine prenatal visit  Plan   Problem List Items Addressed This Visit      Other   Supervision of high risk pregnancy, antepartum   History of intrauterine fetal death in previous pregnancy    Other Visit Diagnoses    [redacted] weeks gestation of pregnancy    -  Primary   Relevant Orders   Other/Misc lab test    First Screen today PNV  Annamarie MajorPaul Coury Grieger, MD, Merlinda FrederickFACOG [facility], [facility] 06/20/2017  2:54 PM

## 2017-06-21 NOTE — L&D Delivery Note (Signed)
Delivery Note At 1:52 PM a viable female child was delivered via Vaginal, Spontaneous (Presentation: LOA).  APGAR: , 9,9; weight pending  .   Placenta status: spontaneous, intact.  Cord: 3VC without complications.  Cord pH: N/A  Anesthesia:  Epidural Episiotomy:  none Lacerations:  none Suture Repair: N/A Est. Blood Loss (mL):  350mL  Mom to postpartum.  Baby to Couplet care / Skin to Skin.  Cathy [name] 12/16/2017, 2:02 PM

## 2017-06-22 ENCOUNTER — Encounter: Payer: Commercial Managed Care - PPO | Admitting: Obstetrics and Gynecology

## 2017-06-22 ENCOUNTER — Other Ambulatory Visit: Payer: Commercial Managed Care - PPO

## 2017-06-23 ENCOUNTER — Other Ambulatory Visit: Payer: Commercial Managed Care - PPO

## 2017-06-23 ENCOUNTER — Encounter: Payer: Commercial Managed Care - PPO | Admitting: Obstetrics and Gynecology

## 2017-06-23 LAB — FIRST TRIMESTER SCREEN W/NT
CRL: 60.9 mm
DIA MoM: 2.61
DIA Value: 643 pg/mL
GEST AGE-COLLECT: 12.4 wk
HCG VALUE: 204.3 [IU]/mL
MATERNAL AGE AT EDD: 25.6 a
NUCHAL TRANSLUCENCY MOM: 1.03
NUCHAL TRANSLUCENCY: 1.6 mm
Number of Fetuses: 1
PAPP-A MoM: 1.07
PAPP-A Value: 1093.2 ng/mL
TEST RESULTS: NEGATIVE
WEIGHT: 144 [lb_av]
hCG MoM: 2.25

## 2017-06-23 NOTE — Progress Notes (Signed)
LM.  Reassuring results from first screen.

## 2017-07-18 ENCOUNTER — Ambulatory Visit (INDEPENDENT_AMBULATORY_CARE_PROVIDER_SITE_OTHER): Payer: Commercial Managed Care - PPO | Admitting: Obstetrics and Gynecology

## 2017-07-18 VITALS — BP 128/64 | Wt 150.0 lb

## 2017-07-18 DIAGNOSIS — Z363 Encounter for antenatal screening for malformations: Secondary | ICD-10-CM

## 2017-07-18 NOTE — Progress Notes (Signed)
ROB

## 2017-07-19 NOTE — Progress Notes (Signed)
Anatomy scan next visit, declines AFP

## 2017-08-06 ENCOUNTER — Emergency Department
Admission: EM | Admit: 2017-08-06 | Discharge: 2017-08-06 | Disposition: A | Discharge status: Home / Self Care | Payer: Commercial Managed Care - PPO | Attending: Student in an Organized Health Care Education/Training Program | Admitting: Student in an Organized Health Care Education/Training Program

## 2017-08-06 ENCOUNTER — Other Ambulatory Visit: Payer: Self-pay

## 2017-08-06 ENCOUNTER — Encounter: Payer: Self-pay | Admitting: Emergency Medicine

## 2017-08-06 DIAGNOSIS — Z3A18 18 weeks gestation of pregnancy: Secondary | ICD-10-CM | POA: Diagnosis not present

## 2017-08-06 DIAGNOSIS — A084 Viral intestinal infection, unspecified: Secondary | ICD-10-CM

## 2017-08-06 DIAGNOSIS — K529 Noninfective gastroenteritis and colitis, unspecified: Secondary | ICD-10-CM | POA: Diagnosis not present

## 2017-08-06 DIAGNOSIS — O9989 Other specified diseases and conditions complicating pregnancy, childbirth and the puerperium: Secondary | ICD-10-CM | POA: Insufficient documentation

## 2017-08-06 LAB — INFLUENZA PANEL BY PCR (TYPE A & B)
Influenza A By PCR: NEGATIVE
Influenza B By PCR: NEGATIVE

## 2017-08-06 MED ORDER — DOXYLAMINE-PYRIDOXINE 10-10 MG PO TBEC: 1.0000 | DELAYED_RELEASE_TABLET | Freq: Three times a day (TID) | ORAL | 0 refills | Status: AC | PRN

## 2017-08-06 MED ORDER — SODIUM CHLORIDE 0.9 % IV SOLN
Freq: Once | INTRAVENOUS | Status: AC
  Administered 2017-08-06: 999 mL via INTRAVENOUS

## 2017-08-06 NOTE — ED Provider Notes (Signed)
[facility] Emergency Department Provider Note  ____________________________________________  Time seen: Approximately 4:03 PM  I have reviewed the triage vital signs and the nursing notes.   HISTORY  Chief Complaint Emesis    HPI Cathy [name] is a 27 y.o. female presents to the emergency department with vomiting and diarrhea that started acutely today.  Patient has recently been in contact with 2 of her nephews who have experienced similar symptoms.  Patient was concerned as she is [redacted] weeks pregnant.  She continues to feel fetal movement and has had no abdominal pain nausea or vomiting.  Patient reported that she had a "cold" several days ago and her symptoms have improved significantly.  Patient denies malaise, rhinorrhea, congestion, headache, shortness of breath and fever/chills.  No alleviating measures have been attempted.   Past Medical History:  Diagnosis Date  . Medical history non-contributory     Patient Active Problem List   Diagnosis Date Noted  . History of intrauterine fetal death in previous pregnancy 05/17/2017  . Supervision of high risk pregnancy, antepartum 05/16/2017    Past Surgical History:  Procedure Laterality Date  . TONSILLECTOMY      Prior to Admission medications   Medication Sig Start Date End Date Taking? Authorizing Provider  Doxylamine-Pyridoxine (DICLEGIS) 10-10 MG TBEC Take 1 tablet by mouth 3 (three) times daily as needed for up to 5 days. 08/06/17 08/11/17  Orvil FeilWoods, Mikel Pyon M, PA-C    Allergies Patient has no known allergies.  History reviewed. No pertinent family history.  Social History Social History   Tobacco Use  . Smoking status: Never Smoker  . Smokeless tobacco: Never Used  Substance Use Topics  . Alcohol use: No  . Drug use: No     Review of Systems  Constitutional: No fever/chills Eyes: No visual changes. No discharge ENT: No upper respiratory complaints. Cardiovascular: no chest  pain. Respiratory: no cough. No SOB. Gastrointestinal: Patient has had vomiting and diarrhea.  Genitourinary: Negative for dysuria. No hematuria Musculoskeletal: Negative for musculoskeletal pain. Skin: Negative for rash, abrasions, lacerations, ecchymosis. Neurological: Negative for headaches, focal weakness or numbness.   ____________________________________________   PHYSICAL EXAM:  VITAL SIGNS: ED Triage Vitals [08/06/17 1305]  Enc Vitals Group     BP 133/74     Pulse Rate 100     Resp 16     Temp 98.2 F (36.8 C)     Temp Source Oral     SpO2 100 %     Weight 150 lb (68 kg)     Height      Head Circumference      Peak Flow      Pain Score      Pain Loc      Pain Edu?      Excl. in GC?      Constitutional: Alert and oriented. Well appearing and in no acute distress. Eyes: Conjunctivae are normal. PERRL. EOMI. Head: Atraumatic. ENT:      Ears: TMs are pearly bilaterally.      Nose: No congestion/rhinnorhea.      Mouth/Throat: Mucous membranes are moist.  Neck: No stridor.  No cervical spine tenderness to palpation. Hematological/Lymphatic/Immunilogical: No cervical lymphadenopathy. Cardiovascular: Normal rate, regular rhythm. Normal S1 and S2.  Good peripheral circulation. Respiratory: Normal respiratory effort without tachypnea or retractions. Lungs CTAB. Good air entry to the bases with no decreased or absent breath sounds. Gastrointestinal: Bowel sounds 4 quadrants. Soft and nontender to palpation. No guarding or rigidity. No  palpable masses. No distention. No CVA tenderness. Musculoskeletal: Full range of motion to all extremities. No gross deformities appreciated. Neurologic:  Normal speech and language. No gross focal neurologic deficits are appreciated.  Skin:  Skin is warm, dry and intact. No rash noted. Psychiatric: Mood and affect are normal. Speech and behavior are normal. Patient exhibits appropriate insight and  judgement.   ____________________________________________   LABS (all labs ordered are listed, but only abnormal results are displayed)  Labs Reviewed  INFLUENZA PANEL BY PCR (TYPE A & B)   ____________________________________________  EKG   ____________________________________________  RADIOLOGY   No results found.  ____________________________________________    PROCEDURES  Procedure(s) performed:    Procedures    Medications  0.9 %  sodium chloride infusion ( Intravenous Stopped 08/06/17 1536)     ____________________________________________   INITIAL IMPRESSION / ASSESSMENT AND PLAN / ED COURSE  Pertinent labs & imaging results that were available during my care of the patient were reviewed by me and considered in my medical decision making (see chart for details).  Review of the [place] CSRS was performed in accordance of the [place] prior to dispensing any controlled drugs.     Assessment and plan Viral gastroenteritis Patient presents to the emergency department with new onset diarrhea and vomiting.  Differential diagnosis included unspecified viral gastroenteritis, Norovirus, and influenza.  Fetal heart tones were assessed by nursing staff at 180 bpm.  Diclegis was prescribed at discharge and supportive measures were encouraged.  Patient was advised to follow-up with primary care as needed.  All patient questions were answered.     ____________________________________________  FINAL CLINICAL IMPRESSION(S) / ED DIAGNOSES  Final diagnoses:  Viral gastroenteritis      NEW MEDICATIONS STARTED DURING THIS VISIT:  ED Discharge Orders        Ordered    Doxylamine-Pyridoxine (DICLEGIS) 10-10 MG TBEC  3 times daily PRN     08/06/17 1521          This chart was dictated using voice recognition software/Dragon. Despite best efforts to proofread, errors can occur which can change the meaning. Any change was purely unintentional.    Orvil Feil, PA-C 08/06/17 1610    Willy Eddy, MD 08/06/17 [phone]

## 2017-08-06 NOTE — ED Notes (Signed)
Pt possibly exposed to norovirus.  Pt states that she has not been able to keep anything down.   Pt reports that she has had multiple episodes of diarrhea and vomiting.  Pt reports that she is feeling baby move like normal.  Pt also states that she has sore throat, coughing and aches.  Pt is A&Ox4, in NAD.

## 2017-08-06 NOTE — ED Triage Notes (Signed)
Pt to ed with c/o vomiting and diarrhea that started about 0730.  Reports vomiting x 2 and diarrhea x 3 since it started.  Pt is [redacted] weeks pregnant.

## 2017-08-06 NOTE — ED Notes (Signed)

## 2017-08-15 ENCOUNTER — Ambulatory Visit (INDEPENDENT_AMBULATORY_CARE_PROVIDER_SITE_OTHER): Payer: Commercial Managed Care - PPO | Admitting: Certified Nurse Midwife

## 2017-08-15 ENCOUNTER — Ambulatory Visit (INDEPENDENT_AMBULATORY_CARE_PROVIDER_SITE_OTHER): Payer: Commercial Managed Care - PPO

## 2017-08-15 ENCOUNTER — Encounter: Payer: Self-pay | Admitting: Certified Nurse Midwife

## 2017-08-15 VITALS — BP 100/60 | Wt 153.0 lb

## 2017-08-15 DIAGNOSIS — Z36 Encounter for antenatal screening for chromosomal anomalies: Secondary | ICD-10-CM | POA: Diagnosis not present

## 2017-08-15 DIAGNOSIS — Z8759 Personal history of other complications of pregnancy, childbirth and the puerperium: Secondary | ICD-10-CM

## 2017-08-15 DIAGNOSIS — Z363 Encounter for antenatal screening for malformations: Secondary | ICD-10-CM | POA: Diagnosis not present

## 2017-08-15 DIAGNOSIS — Z3A19 19 weeks gestation of pregnancy: Secondary | ICD-10-CM

## 2017-08-15 DIAGNOSIS — O0990 Supervision of high risk pregnancy, unspecified, unspecified trimester: Secondary | ICD-10-CM

## 2017-08-15 NOTE — Progress Notes (Signed)
Pt reports no problems. Anatomy scan today. It's a GIRL! 

## 2017-08-15 NOTE — Progress Notes (Signed)
HROB at 19 wk4d (hx of IUFD at near term) and anatomy scan: CGA 20wk1d, posterior placenta, normal anatomy, female fetus. Patient feeling more fetal movement. Feeling well. ROB in 4 weeks.  Cathy [name], CNM

## 2017-09-12 ENCOUNTER — Ambulatory Visit (INDEPENDENT_AMBULATORY_CARE_PROVIDER_SITE_OTHER): Payer: Commercial Managed Care - PPO | Admitting: Obstetrics and Gynecology

## 2017-09-12 VITALS — BP 132/70 | Wt 167.0 lb

## 2017-09-12 DIAGNOSIS — Z8759 Personal history of other complications of pregnancy, childbirth and the puerperium: Secondary | ICD-10-CM

## 2017-09-12 DIAGNOSIS — O0990 Supervision of high risk pregnancy, unspecified, unspecified trimester: Secondary | ICD-10-CM

## 2017-09-12 DIAGNOSIS — Z3A24 24 weeks gestation of pregnancy: Secondary | ICD-10-CM

## 2017-09-12 NOTE — Progress Notes (Signed)
ROB Round ligament pain on right side Discharge (Jelly like)

## 2017-09-12 NOTE — Progress Notes (Addendum)
Routine Prenatal Care Visit  Subjective  Cathy [name] is a 27 y.o. G2P0100 at 5559w4d being seen today for ongoing prenatal care.  She is currently monitored for the following issues for this high-risk pregnancy and has Supervision of high risk pregnancy, antepartum and History of intrauterine fetal death in previous pregnancy on their problem list.  ----------------------------------------------------------------------------------- Patient reports no complaints.   Contractions: Not present. Vag. Bleeding: None.  Movement: Present. Denies leaking of fluid.  ----------------------------------------------------------------------------------- The following portions of the patient's history were reviewed and updated as appropriate: allergies, current medications, past family history, past medical history, past social history, past surgical history and problem list. Problem list updated.   Objective  Blood pressure 132/70, weight 167 lb (75.8 kg), last menstrual period 03/31/2017, unknown if currently breastfeeding. Pregravid weight 140 lb (63.5 kg) Total Weight Gain 27 lb (12.2 kg) Urinalysis:      Fetal Status: Fetal Heart Rate (bpm): 145   Movement: Present     General:  Alert, oriented and cooperative. Patient is in no acute distress.  Skin: Skin is warm and dry. No rash noted.   Cardiovascular: Normal heart rate noted  Respiratory: Normal respiratory effort, no problems with respiration noted  Abdomen: Soft, gravid, appropriate for gestational age. Pain/Pressure: Absent     Pelvic:  Cervical exam deferred        Extremities: Normal range of motion.     ental Status: Normal mood and affect. Normal behavior. Normal judgment and thought content.     Assessment   27 y.o. G2P0100 at 4059w4d by  01/05/2018, by Last Menstrual Period presenting for routine prenatal visit  Plan   Pregnancy#2 Problems (from 03/31/17 to present)    Problem Noted Resolved   History of intrauterine fetal  death in previous pregnancy 05/17/2017 by Vena AustriaStaebler, Gaspar Fowle, MD No   Overview Signed 05/17/2017  9:44 AM by Vena AustriaStaebler, Ion Gonnella, MD    Negative microarray, Torch titers other than HSV, and hypercoagulable work up      Supervision of high risk pregnancy, antepartum 05/16/2017 by Vena AustriaStaebler, Gretchen Weinfeld, MD No   Overview Addendum 09/12/2017 11:27 AM by Vena AustriaStaebler, Katerina Zurn, MD    [facility]inic [facility] Prenatal Labs  Dating LMP=8week US Blood type: O/Positive/-- (11/26 1504)   Genetic Screen 1 Screen: negative Antibody:Negative (11/26 1504)  Anatomic US Normal female Rubella: 1.20 (11/26 1504) Varicella: VI  GTT  RPR: Non Reactive (11/26 1504)   Rhogam N/A HBsAg: Negative (11/26 1504)   TDaP vaccine                      HIV: Non Reactive (11/26 1504)   Baby Food                                GBS:   Contraception  Pap: 08/12/2015 NIL  CBB     CS/VBAC    Support Person Jamal               Gestational age appropriate obstetric precautions including but not limited to vaginal bleeding, contractions, leaking of fluid and fetal movement were reviewed in detail with the patient.   - growth scan and 28 week labs next visit - 2+ protein, normotensive, no symptoms, no history of preeclampsia or GHTN  Return in about 1 month (around 10/10/2017) for ROB, 28 week labs and growth scan.  Vena AustriaAndreas Lavaeh Bau, MD, Merlinda FrederickFACOG [facility], [facility] 09/12/2017, 11:46 AM

## 2017-10-10 ENCOUNTER — Ambulatory Visit (INDEPENDENT_AMBULATORY_CARE_PROVIDER_SITE_OTHER): Payer: Commercial Managed Care - PPO

## 2017-10-10 ENCOUNTER — Encounter: Payer: Self-pay | Admitting: Certified Nurse Midwife

## 2017-10-10 ENCOUNTER — Ambulatory Visit (INDEPENDENT_AMBULATORY_CARE_PROVIDER_SITE_OTHER): Payer: Commercial Managed Care - PPO | Admitting: Certified Nurse Midwife

## 2017-10-10 ENCOUNTER — Other Ambulatory Visit: Payer: Commercial Managed Care - PPO

## 2017-10-10 VITALS — BP 108/62 | Wt 176.0 lb

## 2017-10-10 DIAGNOSIS — Z3A24 24 weeks gestation of pregnancy: Secondary | ICD-10-CM

## 2017-10-10 DIAGNOSIS — Z8759 Personal history of other complications of pregnancy, childbirth and the puerperium: Secondary | ICD-10-CM

## 2017-10-10 DIAGNOSIS — O0990 Supervision of high risk pregnancy, unspecified, unspecified trimester: Secondary | ICD-10-CM | POA: Diagnosis not present

## 2017-10-10 DIAGNOSIS — Z3A27 27 weeks gestation of pregnancy: Secondary | ICD-10-CM

## 2017-10-10 NOTE — Progress Notes (Signed)
Growth scan/28 wk labs today. Pt reports no problems.

## 2017-10-10 NOTE — Progress Notes (Signed)
HROB at 27wk4d/ 28 week labs / growth scan. Hx of IUFD at 36 weeks in 2017 Baby active. Has noticed more edema in LE. Thinking about breast feeding Growth scan: EFW 2#12oz (65.3%) and AFI 15.24cm.  Weight up 9# in last month, +edema of lower LE, BP 108/62 P: ROB in 2 weeks 28 week labs today O POS-not a Rhogam candidate. Cathy [name] Deyonna Fitzsimmons, CNM

## 2017-10-11 LAB — 28 WEEK RH+PANEL
Basophils Absolute: 0 10*3/uL (ref 0.0–0.2)
Basos: 0 %
EOS (ABSOLUTE): 0 10*3/uL (ref 0.0–0.4)
EOS: 0 %
Gestational Diabetes Screen: 91 mg/dL (ref 65–139)
HEMATOCRIT: 27.8 % — AB (ref 34.0–46.6)
HEMOGLOBIN: 9.6 g/dL — AB (ref 11.1–15.9)
HIV SCREEN 4TH GENERATION: NONREACTIVE
Immature Grans (Abs): 0 10*3/uL (ref 0.0–0.1)
Immature Granulocytes: 0 %
LYMPHS ABS: 1.3 10*3/uL (ref 0.7–3.1)
Lymphs: 17 %
MCH: 29.4 pg (ref 26.6–33.0)
MCHC: 34.5 g/dL (ref 31.5–35.7)
MCV: 85 fL (ref 79–97)
MONOCYTES: 9 %
Monocytes Absolute: 0.7 10*3/uL (ref 0.1–0.9)
NEUTROS ABS: 5.6 10*3/uL (ref 1.4–7.0)
Neutrophils: 74 %
Platelets: 212 10*3/uL (ref 150–379)
RBC: 3.26 x10E6/uL — ABNORMAL LOW (ref 3.77–5.28)
RDW: 14.5 % (ref 12.3–15.4)
RPR: NONREACTIVE
WBC: 7.6 10*3/uL (ref 3.4–10.8)

## 2017-10-24 ENCOUNTER — Ambulatory Visit (INDEPENDENT_AMBULATORY_CARE_PROVIDER_SITE_OTHER): Payer: Commercial Managed Care - PPO | Admitting: Obstetrics and Gynecology

## 2017-10-24 VITALS — BP 130/74 | Wt 180.0 lb

## 2017-10-24 DIAGNOSIS — Z3A30 30 weeks gestation of pregnancy: Secondary | ICD-10-CM

## 2017-10-24 DIAGNOSIS — O0990 Supervision of high risk pregnancy, unspecified, unspecified trimester: Secondary | ICD-10-CM

## 2017-10-24 DIAGNOSIS — Z8759 Personal history of other complications of pregnancy, childbirth and the puerperium: Secondary | ICD-10-CM

## 2017-10-24 NOTE — Progress Notes (Signed)
ROB Vaginal pressure 

## 2017-11-07 ENCOUNTER — Ambulatory Visit (INDEPENDENT_AMBULATORY_CARE_PROVIDER_SITE_OTHER): Payer: Commercial Managed Care - PPO | Admitting: Obstetrics and Gynecology

## 2017-11-07 VITALS — BP 128/68 | Wt 188.0 lb

## 2017-11-07 DIAGNOSIS — Z23 Encounter for immunization: Secondary | ICD-10-CM | POA: Diagnosis not present

## 2017-11-07 DIAGNOSIS — O0990 Supervision of high risk pregnancy, unspecified, unspecified trimester: Secondary | ICD-10-CM

## 2017-11-07 DIAGNOSIS — Z3A31 31 weeks gestation of pregnancy: Secondary | ICD-10-CM | POA: Diagnosis not present

## 2017-11-07 DIAGNOSIS — Z8759 Personal history of other complications of pregnancy, childbirth and the puerperium: Secondary | ICD-10-CM

## 2017-11-07 DIAGNOSIS — O2603 Excessive weight gain in pregnancy, third trimester: Secondary | ICD-10-CM

## 2017-11-07 NOTE — Progress Notes (Signed)
    Routine Prenatal Care Visit  Subjective  Cathy [name] is a 27 y.o. G2P0100 at [redacted]w[redacted]d being seen today for ongoing prenatal care.  She is currently monitored for the following issues for this high-risk pregnancy and has Supervision of high risk pregnancy, antepartum and History of intrauterine fetal death in previous pregnancy on their problem list.  ----------------------------------------------------------------------------------- Patient reports no complaints.   Contractions: Not present. Vag. Bleeding: None.  Movement: Present. Denies leaking of fluid.  ----------------------------------------------------------------------------------- The following portions of the patient's history were reviewed and updated as appropriate: allergies, current medications, past family history, past medical history, past social history, past surgical history and problem list. Problem list updated.   Objective  Blood pressure 128/68, weight 188 lb (85.3 kg), last menstrual period 03/31/2017, unknown if currently breastfeeding. Pregravid weight 140 lb (63.5 kg) Total Weight Gain 48 lb (21.8 kg) Urinalysis: Urine Protein: Negative Urine Glucose: Negative  Fetal Status: Fetal Heart Rate (bpm): 140 Fundal Height: 31 cm Movement: Present     General:  Alert, oriented and cooperative. Patient is in no acute distress.  Skin: Skin is warm and dry. No rash noted.   Cardiovascular: Normal heart rate noted  Respiratory: Normal respiratory effort, no problems with respiration noted  Abdomen: Soft, gravid, appropriate for gestational age. Pain/Pressure: Absent     Pelvic:  Cervical exam deferred        Extremities: Normal range of motion.     ental Status: Normal mood and affect. Normal behavior. Normal judgment and thought content.     Assessment   27 y.o. G2P0100 at [redacted]w[redacted]d by  01/05/2018, by Last Menstrual Period presenting for routine prenatal visit  Plan   Pregnancy#2 Problems (from 03/31/17 to present)     Problem Noted Resolved   History of intrauterine fetal death in previous pregnancy 05-23-17 by Vena Austria, MD No   Overview Signed May 23, 2017  9:44 AM by Vena Austria, MD    Negative microarray, Torch titers other than HSV, and hypercoagulable work up      Supervision of high risk pregnancy, antepartum 05/16/2017 by Vena Austria, MD No   Overview Addendum 09/12/2017 11:27 AM by Vena Austria, MD    [facility]inic [facility] Prenatal Labs  Dating LMP=8week [place] Blood type: O/Positive/-- (11/26 1504)   Genetic Screen 1 Screen: negative Antibody:Negative (11/26 1504)  Anatomic [place] Normal female Rubella: 1.20 (11/26 1504) Varicella: VI  GTT  RPR: Non Reactive (11/26 1504)   Rhogam N/A HBsAg: Negative (11/26 1504)   TDaP vaccine                      HIV: Non Reactive (11/26 1504)   Baby Food                                GBS:   Contraception  Pap: 08/12/2015 NIL  CBB     CS/VBAC    Support Person Jamal               Gestational age appropriate obstetric precautions including but not limited to vaginal bleeding, contractions, leaking of fluid and fetal movement were reviewed in detail with the patient.    Return in about 2 weeks (around 11/21/2017) for ROB and growth scan.  Vena Austria, MD, Evern Core [facility], [facility] 11/08/2017, 7:03 PM

## 2017-11-07 NOTE — Progress Notes (Signed)
ROB  TDAP today/blood consent

## 2017-11-21 ENCOUNTER — Ambulatory Visit (INDEPENDENT_AMBULATORY_CARE_PROVIDER_SITE_OTHER): Payer: Commercial Managed Care - PPO

## 2017-11-21 ENCOUNTER — Ambulatory Visit (INDEPENDENT_AMBULATORY_CARE_PROVIDER_SITE_OTHER): Payer: Commercial Managed Care - PPO | Admitting: Obstetrics and Gynecology

## 2017-11-21 VITALS — BP 120/80 | Wt 192.0 lb

## 2017-11-21 DIAGNOSIS — O2603 Excessive weight gain in pregnancy, third trimester: Secondary | ICD-10-CM | POA: Diagnosis not present

## 2017-11-21 DIAGNOSIS — Z8759 Personal history of other complications of pregnancy, childbirth and the puerperium: Secondary | ICD-10-CM | POA: Diagnosis not present

## 2017-11-21 DIAGNOSIS — O0993 Supervision of high risk pregnancy, unspecified, third trimester: Secondary | ICD-10-CM

## 2017-11-21 DIAGNOSIS — Z3A33 33 weeks gestation of pregnancy: Secondary | ICD-10-CM

## 2017-11-21 DIAGNOSIS — O0990 Supervision of high risk pregnancy, unspecified, unspecified trimester: Secondary | ICD-10-CM

## 2017-11-21 DIAGNOSIS — O409XX0 Polyhydramnios, unspecified trimester, not applicable or unspecified: Secondary | ICD-10-CM

## 2017-11-21 MED ORDER — ACCU-CHEK FASTCLIX LANCETS MISC: 1.0000 [IU] | Freq: Four times a day (QID) | 12 refills | Status: DC

## 2017-11-21 MED ORDER — GLUCOSE BLOOD VI STRP: ORAL_STRIP | 12 refills | Status: DC

## 2017-11-21 MED ORDER — ACCU-CHEK NANO SMARTVIEW W/DEVICE KIT: 1.0000 | PACK | 0 refills | Status: DC

## 2017-11-21 NOTE — Progress Notes (Signed)
Routine Prenatal Care Visit  Subjective  Cathy [name] is a 27 y.o. G2P0100 at 8034w4d being seen today for ongoing prenatal care.  She is currently monitored for the following issues for this high-risk pregnancy and has Supervision of high risk pregnancy, antepartum and History of intrauterine fetal death in previous pregnancy on their problem list.  ----------------------------------------------------------------------------------- Patient reports no complaints.   Contractions: Not present. Vag. Bleeding: None.  Movement: Present. Denies leaking of fluid.  ----------------------------------------------------------------------------------- The following portions of the patient's history were reviewed and updated as appropriate: allergies, current medications, past family history, past medical history, past social history, past surgical history and problem list. Problem list updated.   Objective  Blood pressure 120/80, weight 192 lb (87.1 kg), last menstrual period 03/31/2017, unknown if currently breastfeeding. Pregravid weight 140 lb (63.5 kg) Total Weight Gain 52 lb (23.6 kg) Urinalysis: Urine Protein: Negative Urine Glucose: Negative  Fetal Status:     Movement: Present     General:  Alert, oriented and cooperative. Patient is in no acute distress.  Skin: Skin is warm and dry. No rash noted.   Cardiovascular: Normal heart rate noted  Respiratory: Normal respiratory effort, no problems with respiration noted  Abdomen: Soft, gravid, appropriate for gestational age. Pain/Pressure: Present     Pelvic:  Cervical exam deferred        Extremities: Normal range of motion.     ental Status: Normal mood and affect. Normal behavior. Normal judgment and thought content.   [place] Ob Follow Up  Result Date: 11/22/2017 ULTRASOUND REPORT Patient Name: Cathy [name] DOB: 08-09-1990 MRN: [id number] Location: [facility] Date of Service: 11/21/2017 Indications:growth/afi Findings: Mason JimSingleton intrauterine  pregnancy is visualized with FHR at 161 BPM. Biometrics give an (U/S) Gestational age of 2466w5d and an (U/S) EDD of 12/14/2017; this correlates with the clinically established Estimated Date of Delivery: 01/05/18. Fetal presentation is Cephalic. Placenta: Posterior, grade 1. AFI: 28.17 cm, Polyhydramnios Growth percentile is 87th overall with HC at 97th and AC>97th. EFW: 6lbs 8oz Impression: 1. 2834w4d Viable Singleton Intrauterine pregnancy previously established criteria. 2. Growth is 87th overall with HC at 97th and AC>97th %ile.  AFI is 28.17 cm. Recommendations: 1.Clinical correlation with the patient's History and Physical Exam. Willette AlmaKristen Priestley, RDMS, RVT There is a singleton gestation with  amniotic fluid volume showing evidence of polyhydramnios. The fetal biometry is measuring 2 week ahead.  Limited fetal anatomy was performed.The visualized fetal anatomical survey appears within normal limits within the resolution of ultrasound as described above, in particularly stomach and bladder imaged normal.  It must be noted that a normal ultrasound is unable to rule out fetal aneuploidy.  Vena AustriaAndreas Kohan Azizi, MD, Evern CoreFACOG [facility], [facility] 11/22/2017, 9:54 AM     Assessment   26 y.o. G2P0100 at 1334w4d by  01/05/2018, by Last Menstrual Period presenting for routine prenatal visit  Plan   Pregnancy#2 Problems (from 03/31/17 to present)    Problem Noted Resolved   History of intrauterine fetal death in previous pregnancy 05/17/2017 by Vena AustriaStaebler, Leonora Gores, MD No   Overview Signed 05/17/2017  9:44 AM by Vena AustriaStaebler, Aneira Cavitt, MD    Negative microarray, Torch titers other than HSV, and hypercoagulable work up      Supervision of high risk pregnancy, antepartum 05/16/2017 by Vena AustriaStaebler, Milagros Middendorf, MD No   Overview Addendum 09/12/2017 11:27 AM by Vena AustriaStaebler, Rayn Enderson, MD    Clinic [facility] Prenatal Labs  Dating LMP=8week US Blood type: O/Positive/-- (11/26 1504)   Genetic Screen 1  Screen: negative  Antibody:Negative (11/26 1504)  Anatomic [place] Normal female Rubella: 1.20 (11/26 1504) Varicella: VI  GTT  RPR: Non Reactive (11/26 1504)   Rhogam N/A HBsAg: Negative (11/26 1504)   TDaP vaccine                      HIV: Non Reactive (11/26 1504)   Baby Food                                GBS:   Contraception  Pap: 08/12/2015 NIL  CBB     CS/VBAC    Support Person Jamal               Gestational age appropriate obstetric precautions including but not limited to vaginal bleeding, contractions, leaking of fluid and fetal movement were reviewed in detail with the patient.   - polyhydramnios normal bladder and stomach visualized - DP referral for  follow up growth  - finger stick glucose 71 - check glucose values over the next week - Despite measuring 2 week ahead the growth seen does appear symmetric   Return in about 1 week (around 11/28/2017) for NST/AFI.  Vena Austria, MD, Evern Core [facility], [facility] 11/21/2017, 2:50 PM

## 2017-11-24 ENCOUNTER — Telehealth: Payer: Self-pay

## 2017-11-24 NOTE — Telephone Encounter (Signed)
Do we have any meters in the office, if not given normal testing here in office its ok not to test

## 2017-11-24 NOTE — Telephone Encounter (Signed)
Rcvd message from after hours on call service. Pt reports blood glucose monitor rx'd isn't covered by Medicaid.

## 2017-11-24 NOTE — Telephone Encounter (Signed)
Spoke w/pharmacist who ran the rx again as ordered [employer] Smart view as well as Acuchek aviva and guide. All are being denied by medicaid.

## 2017-11-28 ENCOUNTER — Ambulatory Visit (INDEPENDENT_AMBULATORY_CARE_PROVIDER_SITE_OTHER): Payer: Commercial Managed Care - PPO

## 2017-11-28 ENCOUNTER — Ambulatory Visit (INDEPENDENT_AMBULATORY_CARE_PROVIDER_SITE_OTHER): Payer: Commercial Managed Care - PPO | Admitting: Obstetrics & Gynecology

## 2017-11-28 VITALS — BP 120/80 | Wt 194.0 lb

## 2017-11-28 DIAGNOSIS — Z8759 Personal history of other complications of pregnancy, childbirth and the puerperium: Secondary | ICD-10-CM

## 2017-11-28 DIAGNOSIS — O0990 Supervision of high risk pregnancy, unspecified, unspecified trimester: Secondary | ICD-10-CM

## 2017-11-28 DIAGNOSIS — O403XX0 Polyhydramnios, third trimester, not applicable or unspecified: Secondary | ICD-10-CM | POA: Diagnosis not present

## 2017-11-28 DIAGNOSIS — Z3A34 34 weeks gestation of pregnancy: Secondary | ICD-10-CM

## 2017-11-28 DIAGNOSIS — O09293 Supervision of pregnancy with other poor reproductive or obstetric history, third trimester: Secondary | ICD-10-CM | POA: Diagnosis not present

## 2017-11-28 DIAGNOSIS — O409XX0 Polyhydramnios, unspecified trimester, not applicable or unspecified: Secondary | ICD-10-CM

## 2017-11-28 DIAGNOSIS — O0993 Supervision of high risk pregnancy, unspecified, third trimester: Secondary | ICD-10-CM | POA: Diagnosis not present

## 2017-11-28 NOTE — Progress Notes (Signed)
  Subjective  Fetal Movement? yes Contractions? no Leaking Fluid? no Vaginal Bleeding? no  Objective  BP 120/80   Wt 194 lb (88 kg)   LMP 03/31/2017   BMI 32.28 kg/m  General: NAD Pumonary: no increased work of breathing Abdomen: gravid, non-tender Extremities: no edema Psychiatric: mood appropriate, affect full  Assessment  27 y.o. G2P0100 at 2731w4d by  01/05/2018, by Last Menstrual Period presenting for routine prenatal visit  Plan   Problem List Items Addressed This Visit      Other   Supervision of high risk pregnancy, antepartum   History of intrauterine fetal death in previous pregnancy   Polyhydramnios affecting pregnancy in third trimester    Other Visit Diagnoses    [redacted] weeks gestation of pregnancy    -  Primary    Polyhydramnios and Macrosomia- to see DP for US and evaluation on Thurs (Jun 13) Weekly APT, may change or plan IOL due to these risk factors Unable to acquire glucometer for home BS testing    Random BS today is 95  A NST procedure was performed with FHR monitoring and a normal baseline established, appropriate time of 20-40 minutes of evaluation, and accels >2 seen w 15x15 characteristics.  Results show a REACTIVE NST.   Review of ULTRASOUND.    I have personally reviewed images and [facility]    Poly (AFI 35) noted today  Annamarie MajorPaul Harris, MD, Merlinda FrederickFACOG [facility], [facility] 11/28/2017  10:49 AM

## 2017-12-01 ENCOUNTER — Ambulatory Visit: Payer: Commercial Managed Care - PPO

## 2017-12-01 ENCOUNTER — Other Ambulatory Visit: Payer: Self-pay | Admitting: *Deleted

## 2017-12-01 DIAGNOSIS — O403XX0 Polyhydramnios, third trimester, not applicable or unspecified: Secondary | ICD-10-CM

## 2017-12-01 DIAGNOSIS — Z8759 Personal history of other complications of pregnancy, childbirth and the puerperium: Secondary | ICD-10-CM

## 2017-12-01 DIAGNOSIS — O0990 Supervision of high risk pregnancy, unspecified, unspecified trimester: Secondary | ICD-10-CM

## 2017-12-05 ENCOUNTER — Ambulatory Visit (HOSPITAL_BASED_OUTPATIENT_CLINIC_OR_DEPARTMENT_OTHER)
Admission: RE | Admit: 2017-12-05 | Discharge: 2017-12-05 | Disposition: A | Discharge status: Home / Self Care | Payer: Commercial Managed Care - PPO | Source: Ambulatory Visit

## 2017-12-05 ENCOUNTER — Ambulatory Visit
Admission: RE | Admit: 2017-12-05 | Discharge: 2017-12-05 | Disposition: A | Discharge status: Home / Self Care | Payer: Commercial Managed Care - PPO | Source: Ambulatory Visit | Attending: Obstetrics & Gynecology | Admitting: Obstetrics & Gynecology

## 2017-12-05 DIAGNOSIS — O403XX0 Polyhydramnios, third trimester, not applicable or unspecified: Secondary | ICD-10-CM | POA: Insufficient documentation

## 2017-12-05 DIAGNOSIS — Z8759 Personal history of other complications of pregnancy, childbirth and the puerperium: Secondary | ICD-10-CM

## 2017-12-05 DIAGNOSIS — O0990 Supervision of high risk pregnancy, unspecified, unspecified trimester: Secondary | ICD-10-CM

## 2017-12-05 DIAGNOSIS — Z3A35 35 weeks gestation of pregnancy: Secondary | ICD-10-CM | POA: Insufficient documentation

## 2017-12-05 DIAGNOSIS — O09893 Supervision of other high risk pregnancies, third trimester: Secondary | ICD-10-CM | POA: Diagnosis not present

## 2017-12-05 LAB — GLUCOSE, CAPILLARY: Glucose-Capillary: 101 mg/dL — ABNORMAL HIGH (ref 65–99)

## 2017-12-05 NOTE — Progress Notes (Addendum)
[facility].  [name] Reason for Referral: History of fetal demise  Ms. Cathy [name] is [name] 27 year-old G2 P0100 at 3135 4/7 weeks who presents for MFM consultation due to history of fetal demise at 36 weeks in 2017.  She presented for routine prenatal visit and was found to have experienced [name] fetal demise. She underwent an uncomplicated labor induction and had [name] spontaneous vaginal delivery. The delivery note reports normal-appearing female infant and [name] normal-appearing umbilical cord.  The placental pathology was normal. Placental cultures were obtained and were normal.  In addition, APS testing was normal and chromosomal SNP microarray was normal.  No autopsy was performed.  The current pregnancy has been going well.  [name] demonstrated an EFW of 6 pounds 8 ounces at 33 4/7 weeks. The AFI was 28.  Followup scan on June 10 showed an AFI of 35.  The anatomy scan was reported as being normal.   Ms. Cathy [name] had [name] normal glucola on 10/10/17 with [name] value of 91.  She reports good fetal movement.      PMH:  Denies PSH:  Tonsillectomy PObH:  G2 P0010, see HPI Meds: Prenatal vitamin with iron All: NKDA SH: No tobacco. Employed in [name] ROS: No complaints  US (Today): EFW 3525 g (>97%) with AC four weeks ahead. AFI 25.4 with MVP of 7.6 cm  Prenatal labs (05/16/17): Blood type O positive, antibody screen neg, HIV non-reactive, Hep B neg, RPR non-reative, Varicella immune, Rubella immune Hct 30.9, MCV 86.2, Gc/Chl neg  First trimester screen (06/20/17): normal  Third trimester labs (10/10/16): Hct 27.8, MCV 85, Plt 212, glucola 91   Fetal Demise workup Labs (12/18/15): lupus anticoagulant neg, anticardiolpin antibodies neg, Parvo IgG and IgM neg, Rubella IgM neg, CMV IgM neg, Toxo IgM neg, Protein C level 79, Protein S level 77, ATIII level 91, antibody screen neg, KB positive  with 4.94 % fetal cells seen (estimate of 150-165 ml at 36 weeks)  Placental path (12/17/15): Third trimester placenta weight 383 g, three-vessel cord, no chorioamnionitis, no funisitis, no vasculitis.  Loosely attached blood clot on maternal surface.   Birthweight (12/17/15 delivery):  Not recorded but patient thinks that BW was 6.5 pounds (~3000 kg) at 36 weeks.    Fetal-placental blood volume at 36 weeks is estimated to be 100 ml/kg, therefore the baby lost approximately 50% of its blood volume, likely due to maternal-fetal hemorrhage   Assessment and Recommendation Ms. Cathy [name] is [name] 27 year-old G2 P0100 at 135 4/7 weeks with history of fetal demise; likely due to maternal-fetal hemorrhage.  She denies any trauma or signs of placental abruption at time of diagnosis or prior to.  This likely was [name] silent hemorrhage.  Today's scan shows macrosomia with mild polyhydramnios.  Given history of late term fetal demise likely due to [name] silent maternal-fetal hemorrhage, I recommend an early term delivery. Maternal-fetal hemorrhages can recur.    Recs: -Anemia on third-trimester labs. Please check CBC, ferritin, iron, hemoglobin electrophoresis, TIBC at next OB visit.  I asked to her start taking oral iron 325 mg twice daily.  Contact Hematology to see if is candidate for IV iron infusion as this will be the fastest way to increase her hematocrit prior to delivery -History of IUFD at 36 weeks. KB suggests maternal-fetal hemorrhage as etiology.  Normal anat scan today.  Continue twice-weekly fetal testing and recommend delivery during the 37th  week -Mild polyhydramnios.  Anatomy exam overall normal today and stomach and bowel loops appear normal. Normal glucola. Please alert pediatricians at time of delivery due to finding of mild polyhydramnios. Differential includes upper GI tract obstruction or swallowing abnormalities, though most likely is [name] normal variant as is mild and anatomy scan normal.   Cathy [name],  Cathy A, MD

## 2017-12-06 ENCOUNTER — Encounter: Payer: Self-pay | Admitting: Obstetrics & Gynecology

## 2017-12-06 ENCOUNTER — Telehealth: Payer: Self-pay

## 2017-12-06 NOTE — Telephone Encounter (Signed)
OK for someone to provide this for patient today.

## 2017-12-06 NOTE — Telephone Encounter (Signed)
Please advise 

## 2017-12-06 NOTE — Telephone Encounter (Signed)
Pt calling to see if she can get a doctor's note to be out of work starting tomorrow.  She is scheduled for IOL next week and wants some time beforehand.  Pt will p/u.  [phone]

## 2017-12-06 NOTE — Progress Notes (Signed)
Letter written by Dr Tiburcio PeaHarris for patient to come out of work starting today.

## 2017-12-06 NOTE — Telephone Encounter (Signed)
Pt aware to pick up letter.

## 2017-12-06 NOTE — Telephone Encounter (Signed)
Letter written by Dr Tiburcio PeaHarris and printed for patient to pick up.

## 2017-12-07 ENCOUNTER — Other Ambulatory Visit: Payer: Self-pay | Admitting: Obstetrics & Gynecology

## 2017-12-07 DIAGNOSIS — O403XX0 Polyhydramnios, third trimester, not applicable or unspecified: Secondary | ICD-10-CM

## 2017-12-08 ENCOUNTER — Other Ambulatory Visit: Payer: Self-pay

## 2017-12-08 ENCOUNTER — Encounter: Payer: Self-pay | Admitting: Advanced Practice Midwife

## 2017-12-08 ENCOUNTER — Ambulatory Visit: Payer: Commercial Managed Care - PPO

## 2017-12-08 ENCOUNTER — Observation Stay
Admission: EM | Admit: 2017-12-08 | Discharge: 2017-12-08 | Disposition: A | Discharge status: Home / Self Care | Payer: Commercial Managed Care - PPO | Source: Ambulatory Visit | Attending: Obstetrics and Gynecology | Admitting: Obstetrics and Gynecology

## 2017-12-08 ENCOUNTER — Ambulatory Visit (INDEPENDENT_AMBULATORY_CARE_PROVIDER_SITE_OTHER): Payer: Commercial Managed Care - PPO | Admitting: Advanced Practice Midwife

## 2017-12-08 VITALS — BP 140/80 | Wt 198.0 lb

## 2017-12-08 DIAGNOSIS — O403XX0 Polyhydramnios, third trimester, not applicable or unspecified: Secondary | ICD-10-CM

## 2017-12-08 DIAGNOSIS — Z3A36 36 weeks gestation of pregnancy: Secondary | ICD-10-CM | POA: Diagnosis not present

## 2017-12-08 DIAGNOSIS — Z113 Encounter for screening for infections with a predominantly sexual mode of transmission: Secondary | ICD-10-CM

## 2017-12-08 DIAGNOSIS — O26893 Other specified pregnancy related conditions, third trimester: Secondary | ICD-10-CM | POA: Diagnosis present

## 2017-12-08 DIAGNOSIS — Z8759 Personal history of other complications of pregnancy, childbirth and the puerperium: Secondary | ICD-10-CM

## 2017-12-08 DIAGNOSIS — R030 Elevated blood-pressure reading, without diagnosis of hypertension: Secondary | ICD-10-CM

## 2017-12-08 DIAGNOSIS — O09293 Supervision of pregnancy with other poor reproductive or obstetric history, third trimester: Secondary | ICD-10-CM

## 2017-12-08 DIAGNOSIS — O09299 Supervision of pregnancy with other poor reproductive or obstetric history, unspecified trimester: Secondary | ICD-10-CM | POA: Diagnosis not present

## 2017-12-08 DIAGNOSIS — Z3685 Encounter for antenatal screening for Streptococcus B: Secondary | ICD-10-CM

## 2017-12-08 DIAGNOSIS — O0993 Supervision of high risk pregnancy, unspecified, third trimester: Secondary | ICD-10-CM

## 2017-12-08 DIAGNOSIS — O0990 Supervision of high risk pregnancy, unspecified, unspecified trimester: Secondary | ICD-10-CM

## 2017-12-08 LAB — OB RESULTS CONSOLE GBS: STREP GROUP B AG: NEGATIVE

## 2017-12-08 LAB — CBC
HCT: 31.9 % — ABNORMAL LOW (ref 35.0–47.0)
Hemoglobin: 11 g/dL — ABNORMAL LOW (ref 12.0–16.0)
MCH: 30 pg (ref 26.0–34.0)
MCHC: 34.5 g/dL (ref 32.0–36.0)
MCV: 87 fL (ref 80.0–100.0)
PLATELETS: 183 10*3/uL (ref 150–440)
RBC: 3.67 MIL/uL — ABNORMAL LOW (ref 3.80–5.20)
RDW: 14.7 % — ABNORMAL HIGH (ref 11.5–14.5)
WBC: 8.2 10*3/uL (ref 3.6–11.0)

## 2017-12-08 LAB — PROTEIN / CREATININE RATIO, URINE
Creatinine, Urine: 129 mg/dL
Protein Creatinine Ratio: 0.08 mg/mg{Cre} (ref 0.00–0.15)
Total Protein, Urine: 10 mg/dL

## 2017-12-08 LAB — COMPREHENSIVE METABOLIC PANEL
ALK PHOS: 90 U/L (ref 38–126)
ALT: 13 U/L — AB (ref 14–54)
AST: 19 U/L (ref 15–41)
Albumin: 3.2 g/dL — ABNORMAL LOW (ref 3.5–5.0)
Anion gap: 9 (ref 5–15)
BILIRUBIN TOTAL: 0.5 mg/dL (ref 0.3–1.2)
BUN: 7 mg/dL (ref 6–20)
CHLORIDE: 107 mmol/L (ref 101–111)
CO2: 19 mmol/L — ABNORMAL LOW (ref 22–32)
Calcium: 8.7 mg/dL — ABNORMAL LOW (ref 8.9–10.3)
Creatinine, Ser: 0.45 mg/dL (ref 0.44–1.00)
GFR calc Af Amer: >60 mL/min (ref >60)
Glucose, Bld: 80 mg/dL (ref 65–99)
Potassium: 3.3 mmol/L — ABNORMAL LOW (ref 3.5–5.1)
Sodium: 135 mmol/L (ref 135–145)
TOTAL PROTEIN: 6.3 g/dL — AB (ref 6.5–8.1)

## 2017-12-08 MED ORDER — ACETAMINOPHEN 325 MG PO TABS: 650.0000 mg | ORAL_TABLET | ORAL | Status: DC | PRN

## 2017-12-08 NOTE — OB Triage Note (Signed)
[facility] for evaluation due to elevated blood pressures.

## 2017-12-08 NOTE — Patient Instructions (Signed)
Vaginal Delivery Vaginal delivery means that you will give birth by pushing your baby out of your birth canal (vagina). A team of health care providers will help you before, during, and after vaginal delivery. Birth experiences are unique for every woman and every pregnancy, and birth experiences vary depending on where you choose to give birth. What should I do to prepare for my baby's birth? Before your baby is born, it is important to talk with your health care provider about:  Your labor and delivery preferences. These may include: ? Medicines that you may be given. ? How you will manage your pain. This might include non-medical pain relief techniques or injectable pain relief such as epidural analgesia. ? How you and your baby will be monitored during labor and delivery. ? Who may be in the labor and delivery room with you. ? Your feelings about surgical delivery of your baby (cesarean delivery, or C-section) if this becomes necessary. ? Your feelings about receiving donated blood through an IV tube (blood transfusion) if this becomes necessary.  Whether you are able: ? To take pictures or videos of the birth. ? To eat during labor and delivery. ? To move around, walk, or change positions during labor and delivery.  What to expect after your baby is born, such as: ? Whether delayed umbilical cord clamping and cutting is offered. ? Who will care for your baby right after birth. ? Medicines or tests that may be recommended for your baby. ? Whether breastfeeding is supported in your hospital or birth center. ? How long you will be in the hospital or birth center.  How any medical conditions you have may affect your baby or your labor and delivery experience.  To prepare for your baby's birth, you should also:  Attend all of your health care visits before delivery (prenatal visits) as recommended by your health care provider. This is important.  Prepare your home for your baby's  arrival. Make sure that you have: ? Diapers. ? Baby clothing. ? Feeding equipment. ? Safe sleeping arrangements for you and your baby.  Install a car seat in your vehicle. Have your car seat checked by a certified car seat installer to make sure that it is installed safely.  Think about who will help you with your new baby at home for at least the first several weeks after delivery.  What can I expect when I arrive at the birth center or hospital? Once you are in labor and have been admitted into the hospital or birth center, your health care provider may:  Review your pregnancy history and any concerns you have.  Insert an IV tube into one of your veins. This is used to give you fluids and medicines.  Check your blood pressure, pulse, temperature, and heart rate (vital signs).  Check whether your bag of water (amniotic sac) has broken (ruptured).  Talk with you about your birth plan and discuss pain control options.  Monitoring Your health care provider may monitor your contractions (uterine monitoring) and your baby's heart rate (fetal monitoring). You may need to be monitored:  Often, but not continuously (intermittently).  All the time or for long periods at a time (continuously). Continuous monitoring may be needed if: ? You are taking certain medicines, such as medicine to relieve pain or make your contractions stronger. ? You have pregnancy or labor complications.  Monitoring may be done by:  Placing a special stethoscope or a handheld monitoring device on your abdomen to   check your baby's heartbeat, and feeling your abdomen for contractions. This method of monitoring does not continuously record your baby's heartbeat or your contractions.  Placing monitors on your abdomen (external monitors) to record your baby's heartbeat and the frequency and length of contractions. You may not have to wear external monitors all the time.  Placing monitors inside of your uterus  (internal monitors) to record your baby's heartbeat and the frequency, length, and strength of your contractions. ? Your health care provider may use internal monitors if he or she needs more information about the strength of your contractions or your baby's heart rate. ? Internal monitors are put in place by passing a thin, flexible wire through your vagina and into your uterus. Depending on the type of monitor, it may remain in your uterus or on your baby's head until birth. ? Your health care provider will discuss the benefits and risks of internal monitoring with you and will ask for your permission before inserting the monitors.  Telemetry. This is a type of continuous monitoring that can be done with external or internal monitors. Instead of having to stay in bed, you are able to move around during telemetry. Ask your health care provider if telemetry is an option for you.  Physical exam Your health care provider may perform a physical exam. This may include:  Checking whether your baby is positioned: ? With the head toward your vagina (head-down). This is most common. ? With the head toward the top of your uterus (head-up or breech). If your baby is in a breech position, your health care provider may try to turn your baby to a head-down position so you can deliver vaginally. If it does not seem that your baby can be born vaginally, your provider may recommend surgery to deliver your baby. In rare cases, you may be able to deliver vaginally if your baby is head-up (breech delivery). ? Lying sideways (transverse). Babies that are lying sideways cannot be delivered vaginally.  Checking your cervix to determine: ? Whether it is thinning out (effacing). ? Whether it is opening up (dilating). ? How low your baby has moved into your birth canal.  What are the three stages of labor and delivery?  Normal labor and delivery is divided into the following three stages: Stage 1  Stage 1 is the  longest stage of labor, and it can last for hours or days. Stage 1 includes: ? Early labor. This is when contractions may be irregular, or regular and mild. Generally, early labor contractions are more than 10 minutes apart. ? Active labor. This is when contractions get longer, more regular, more frequent, and more intense. ? The transition phase. This is when contractions happen very close together, are very intense, and may last longer than during any other part of labor.  Contractions generally feel mild, infrequent, and irregular at first. They get stronger, more frequent (about every 2-3 minutes), and more regular as you progress from early labor through active labor and transition.  Many women progress through stage 1 naturally, but you may need help to continue making progress. If this happens, your health care provider may talk with you about: ? Rupturing your amniotic sac if it has not ruptured yet. ? Giving you medicine to help make your contractions stronger and more frequent.  Stage 1 ends when your cervix is completely dilated to 4 inches (10 cm) and completely effaced. This happens at the end of the transition phase. Stage 2  Once   your cervix is completely effaced and dilated to 4 inches (10 cm), you may start to feel an urge to push. It is common for the body to naturally take a rest before feeling the urge to push, especially if you received an epidural or certain other pain medicines. This rest period may last for up to 1-2 hours, depending on your unique labor experience.  During stage 2, contractions are generally less painful, because pushing helps relieve contraction pain. Instead of contraction pain, you may feel stretching and burning pain, especially when the widest part of your baby's head passes through the vaginal opening (crowning).  Your health care provider will closely monitor your pushing progress and your baby's progress through the vagina during stage 2.  Your  health care provider may massage the area of skin between your vaginal opening and anus (perineum) or apply warm compresses to your perineum. This helps it stretch as the baby's head starts to crown, which can help prevent perineal tearing. ? In some cases, an incision may be made in your perineum (episiotomy) to allow the baby to pass through the vaginal opening. An episiotomy helps to make the opening of the vagina larger to allow more room for the baby to fit through.  It is very important to breathe and focus so your health care provider can control the delivery of your baby's head. Your health care provider may have you decrease the intensity of your pushing, to help prevent perineal tearing.  After delivery of your baby's head, the shoulders and the rest of the body generally deliver very quickly and without difficulty.  Once your baby is delivered, the umbilical cord may be cut right away, or this may be delayed for 1-2 minutes, depending on your baby's health. This may vary among health care providers, hospitals, and birth centers.  If you and your baby are healthy enough, your baby may be placed on your chest or abdomen to help maintain the baby's temperature and to help you bond with each other. Some mothers and babies start breastfeeding at this time. Your health care team will dry your baby and help keep your baby warm during this time.  Your baby may need immediate care if he or she: ? Showed signs of distress during labor. ? Has a medical condition. ? Was born too early (prematurely). ? Had a bowel movement before birth (meconium). ? Shows signs of difficulty transitioning from being inside the uterus to being outside of the uterus. If you are planning to breastfeed, your health care team will help you begin a feeding. Stage 3  The third stage of labor starts immediately after the birth of your baby and ends after you deliver the placenta. The placenta is an organ that develops  during pregnancy to provide oxygen and nutrients to your baby in the womb.  Delivering the placenta may require some pushing, and you may have mild contractions. Breastfeeding can stimulate contractions to help you deliver the placenta.  After the placenta is delivered, your uterus should tighten (contract) and become firm. This helps to stop bleeding in your uterus. To help your uterus contract and to control bleeding, your health care provider may: ? Give you medicine by injection, through an IV tube, by mouth, or through your rectum (rectally). ? Massage your abdomen or perform a vaginal exam to remove any blood clots that are left in your uterus. ? Empty your bladder by placing a thin, flexible tube (catheter) into your bladder. ? Encourage   you to breastfeed your baby. After labor is over, you and your baby will be monitored closely to ensure that you are both healthy until you are ready to go home. Your health care team will teach you how to care for yourself and your baby. This information is not intended to replace advice given to you by your health care provider. Make sure you discuss any questions you have with your health care provider. Document Released: 03/16/2008 Document Revised: 12/26/2015 Document Reviewed: 06/22/2015 Elsevier Interactive Patient Education  2018 [employer]  

## 2017-12-08 NOTE — Discharge Summary (Signed)
See final progress note. 

## 2017-12-08 NOTE — Progress Notes (Signed)
ROB AFI/NST B/P recheck 154/84

## 2017-12-08 NOTE — Progress Notes (Signed)
  Routine Prenatal Care Visit  Subjective  Cathy [name] is a 27 y.o. G2P0100 at 2466w0d being seen today for ongoing prenatal care.  She is currently monitored for the following issues for this high-risk pregnancy and has Supervision of high risk pregnancy, antepartum; History of intrauterine fetal death in previous pregnancy; and Polyhydramnios affecting pregnancy in third trimester on their problem list.  ----------------------------------------------------------------------------------- Patient reports no complaints.   Contractions: Not present. Vag. Bleeding: None.  Movement: Present. Denies leaking of fluid.  ----------------------------------------------------------------------------------- The following portions of the patient's history were reviewed and updated as appropriate: allergies, current medications, past family history, past medical history, past social history, past surgical history and problem list. Problem list updated.   Objective  Blood pressure 140/80, weight 198 lb (89.8 kg), last menstrual period 03/31/2017 Pregravid weight 140 lb (63.5 kg) Total Weight Gain 58 lb (26.3 kg)   Repeat BP: 154/84  Urinalysis: Urine Protein: Negative Urine Glucose: Negative  Fetal Status: Fetal Heart Rate (bpm): 145   Movement: Present     General:  Alert, oriented and cooperative. Patient is in no acute distress.  Skin: Skin is warm and dry. No rash noted.   Cardiovascular: Normal heart rate noted  Respiratory: Normal respiratory effort, no problems with respiration noted  Abdomen: Soft, gravid, appropriate for gestational age. Pain/Pressure: Present     Pelvic:  Cervical exam deferred        Extremities: Normal range of motion.  Edema: None  Mental Status: Normal mood and affect. Normal behavior. Normal judgment and thought content.   Assessment   27 y.o. G2P0100 at 1966w0d by  01/05/2018, by Last Menstrual Period presenting for routine prenatal visit  Plan   Pregnancy#2  Problems (from 03/31/17 to present)    Problem Noted Resolved   History of intrauterine fetal death in previous pregnancy 05/17/2017 by Vena AustriaStaebler, Andreas, MD No   Overview Signed 05/17/2017  9:44 AM by Vena AustriaStaebler, Andreas, MD    Negative microarray, Torch titers other than HSV, and hypercoagulable work up      Supervision of high risk pregnancy, antepartum 05/16/2017 by Vena AustriaStaebler, Andreas, MD No   Overview Addendum 12/05/2017  1:21 PM by Vena AustriaStaebler, Andreas, MD    Clinic [facility] Prenatal Labs  Dating LMP=8week US Blood type: O/Positive/-- (11/26 1504)   Genetic Screen 1 Screen: negative Antibody:Negative (11/26 1504)  Anatomic US Normal female Rubella: 1.20 (11/26 1504) Varicella: VI  GTT 91 RPR: Non Reactive (11/26 1504)   Rhogam N/A HBsAg: Negative (11/26 1504)   TDaP vaccine 5/20 HIV: Non Reactive (11/26 1504)   Baby Food                                GBS:   Contraception  Pap: 08/12/2015 NIL  CBB     CS/VBAC    Support Person Jamal               Preterm labor symptoms and general obstetric precautions including but not limited to vaginal bleeding, contractions, leaking of fluid and fetal movement were reviewed in detail with the patient. Please refer to After Visit Summary for other counseling recommendations.   Return on Monday for BP check, NST, ROB Return in about 1 week (around 12/15/2017) for afi/nst/rob.   [facility] given 2 elevated blood pressures  Tresea MallJane Herschel Fleagle, [place] 12/08/2017 4:08 PM

## 2017-12-08 NOTE — Final Progress Note (Signed)
Physician Final Progress Note  Patient ID: Cathy [name] MRN: [id number] DOB/AGE: [birth year] 26 y.o.  Admit date: 12/08/2017 Admitting provider: Malachy Mood, MD Discharge date: 12/08/2017   Admission Diagnoses: Elevated BP in clinic  Discharge Diagnoses:  Active Problems:   Labor and delivery indication for care or intervention  27 y.o. G2P0100 at 41w0dpresenting for evaluation after elevated BP noted in clinic today.  Normotensive and asymptomatic on presentation.  Laboratory evaluation negative.  The patient does have a history of prior IUFD, plan for IOL at 37 week per MFM recommendation.  Initially scheduled for Saturday 6/29 but moved up to Thursday 363w0doday.  +FM, no LOF, no VB  Vitals:   12/08/17 1752 12/08/17 1807 12/08/17 1822 12/08/17 1837  BP: 119/80 118/69 115/72 124/74  Pulse: (!) 111 (!) 106 98 100  Temp:      TempSrc:      Weight:      Height:         Consults: None  Significant Findings/ Diagnostic Studies:  Results for orders placed or performed during the hospital encounter of 12/08/17 (from the past 72 hour(s))  Protein / creatinine ratio, urine     Status: None   Collection Time: 12/08/17  5:01 PM  Result Value Ref Range   Creatinine, Urine 129 mg/dL   Total Protein, Urine 10 mg/dL    Comment: NO NORMAL RANGE ESTABLISHED FOR THIS TEST   Protein Creatinine Ratio 0.08 0.00 - 0.15 mg/mg[Cre]    Comment: Performed at [facility] [address]Comprehensive metabolic panel     Status: Abnormal   Collection Time: 12/08/17  5:25 PM  Result Value Ref Range   Sodium 135 135 - 145 mmol/L   Potassium 3.3 (L) 3.5 - 5.1 mmol/L   Chloride 107 101 - 111 mmol/L   CO2 19 (L) 22 - 32 mmol/L   Glucose, Bld 80 65 - 99 mg/dL   BUN 7 6 - 20 mg/dL   Creatinine, Ser 0.45 0.44 - 1.00 mg/dL   Calcium 8.7 (L) 8.9 - 10.3 mg/dL   Total Protein 6.3 (L) 6.5 - 8.1 g/dL   Albumin 3.2 (L) 3.5 - 5.0 g/dL   AST 19 15 - 41 U/L   ALT 13 (L) 14 - 54 U/L   Alkaline Phosphatase 90 38 - 126 U/L   Total Bilirubin 0.5 0.3 - 1.2 mg/dL   GFR calc non Af Amer >60 >60 mL/min   GFR calc Af Amer >60 >60 mL/min    Comment: (NOTE) The eGFR has been calculated using the CKD EPI equation. This calculation has not been validated in all clinical situations. eGFR's persistently <60 mL/min signify possible Chronic Kidney Disease.    Anion gap 9 5 - 15    Comment: Performed at [facility] [address]CBC     Status: Abnormal   Collection Time: 12/08/17  5:25 PM  Result Value Ref Range   WBC 8.2 3.6 - 11.0 K/uL   RBC 3.67 (L) 3.80 - 5.20 MIL/uL   Hemoglobin 11.0 (L) 12.0 - 16.0 g/dL   HCT 31.9 (L) 35.0 - 47.0 %   MCV 87.0 80.0 - 100.0 fL   MCH 30.0 26.0 - 34.0 pg   MCHC 34.5 32.0 - 36.0 g/dL   RDW 14.7 (H) 11.5 - 14.5 %   Platelets 183 150 - 440 K/uL    Comment: [facility] at [facility], [place] [address]   Procedures:  Baseline: 140 Variability: moderate Accelerations: present Decelerations: absent Tocometry: q18mn on admisson not feeling these The patient was monitored for 30 minutes, fetal heart rate tracing was deemed reactive, category I tracing,  Discharge Condition: good  Disposition: Discharge disposition: 01-Home or Self Care       Diet: Regular diet  Discharge Activity: Activity as tolerated  Discharge Instructions    Discharge activity:  No Restrictions   Complete by:  As directed    Discharge diet:  No restrictions   Complete by:  As directed    Fetal Kick Count:  Lie on our left side for one hour after a meal, and count the number of times your baby kicks.  If it is less than 5 times, get up, move around and drink some juice.  Repeat the test 30 minutes later.  If it is still less than 5 kicks in an hour, notify your doctor.   Complete by:  As directed    LABOR:  When conractions begin, you should start to time them  from the beginning of one contraction to the beginning  of the next.  When contractions are 5 - 10 minutes apart or less and have been regular for at least an hour, you should call your health care provider.   Complete by:  As directed    No sexual activity restrictions   Complete by:  As directed    Notify physician for bleeding from the vagina   Complete by:  As directed    Notify physician for blurring of vision or spots before the eyes   Complete by:  As directed    Notify physician for chills or fever   Complete by:  As directed    Notify physician for fainting spells, "black outs" or loss of consciousness   Complete by:  As directed    Notify physician for increase in vaginal discharge   Complete by:  As directed    Notify physician for leaking of fluid   Complete by:  As directed    Notify physician for pain or burning when urinating   Complete by:  As directed    Notify physician for pelvic pressure (sudden increase)   Complete by:  As directed    Notify physician for severe or continued nausea or vomiting   Complete by:  As directed    Notify physician for sudden gushing of fluid from the vagina (with or without continued leaking)   Complete by:  As directed    Notify physician for sudden, constant, or occasional abdominal pain   Complete by:  As directed    Notify physician if baby moving less than usual   Complete by:  As directed      Allergies as of 12/08/2017   No Known Allergies     Medication List    TAKE these medications   ACCU-CHEK FASTCLIX LANCETS Misc 1 Units by Percutaneous route 4 (four) times daily.   ACCU-CHEK NANO SMARTVIEW w/Device Kit 1 kit by Subdermal route as directed. Check blood sugars for fasting, and two hours after breakfast, lunch and dinner (4 checks daily)   ferrous sulfate 325 (65 FE) MG EC tablet Take 325 mg by mouth 3 (three) times daily with meals.   FUSION PLUS PO Take 1 tablet by mouth once.   glucose blood test strip Use  as instructed        Total time spent taking care of  this patient: 30 minutes  Signed: Malachy Mood 12/08/2017, 6:44 PM

## 2017-12-10 LAB — STREP GP B NAA: STREP GROUP B AG: NEGATIVE

## 2017-12-11 LAB — CHLAMYDIA/GONOCOCCUS/TRICHOMONAS, NAA
Chlamydia by NAA: NEGATIVE
Gonococcus by NAA: NEGATIVE
Trich vag by NAA: NEGATIVE

## 2017-12-12 ENCOUNTER — Ambulatory Visit (INDEPENDENT_AMBULATORY_CARE_PROVIDER_SITE_OTHER): Payer: Commercial Managed Care - PPO | Admitting: Certified Nurse Midwife

## 2017-12-12 VITALS — BP 120/70 | Wt 200.0 lb

## 2017-12-12 DIAGNOSIS — Z8759 Personal history of other complications of pregnancy, childbirth and the puerperium: Secondary | ICD-10-CM

## 2017-12-12 DIAGNOSIS — O0990 Supervision of high risk pregnancy, unspecified, unspecified trimester: Secondary | ICD-10-CM

## 2017-12-12 DIAGNOSIS — O09293 Supervision of pregnancy with other poor reproductive or obstetric history, third trimester: Secondary | ICD-10-CM | POA: Diagnosis not present

## 2017-12-12 DIAGNOSIS — Z3A36 36 weeks gestation of pregnancy: Secondary | ICD-10-CM

## 2017-12-12 DIAGNOSIS — Z3689 Encounter for other specified antenatal screening: Secondary | ICD-10-CM

## 2017-12-12 LAB — FETAL NONSTRESS TEST

## 2017-12-12 NOTE — Progress Notes (Signed)
ROB/ NST at 36wk4d due to hx of 36 week stillbirth. Has had some mildly elevated pressures recently, but today's pressure normal. No proteinuria LGA baby expected (EFW 7#12oz 1 week ago).  Both patient and partner were around 9# at birth NST today reactive with baseline 140-145 and accelerations to 160, moderate variability, no decelerations GBS negative IOL on 6/27 Discussed process for induction and methods used. Labor precautions  Farrel Connersolleen Jheremy Boger, CNM

## 2017-12-12 NOTE — Progress Notes (Signed)
Pt reports no problems. NST today. Has not picked up glucose testing supplies.

## 2017-12-15 ENCOUNTER — Other Ambulatory Visit: Payer: Self-pay

## 2017-12-15 ENCOUNTER — Inpatient Hospital Stay
Admission: EM | Admit: 2017-12-15 | Discharge: 2017-12-18 | DRG: 807 | Disposition: A | Discharge status: Home / Self Care | Payer: Commercial Managed Care - PPO | Attending: Obstetrics and Gynecology | Admitting: Obstetrics and Gynecology

## 2017-12-15 ENCOUNTER — Inpatient Hospital Stay: Payer: Commercial Managed Care - PPO | Admitting: Anesthesiology

## 2017-12-15 DIAGNOSIS — O403XX0 Polyhydramnios, third trimester, not applicable or unspecified: Principal | ICD-10-CM | POA: Diagnosis present

## 2017-12-15 DIAGNOSIS — O0990 Supervision of high risk pregnancy, unspecified, unspecified trimester: Secondary | ICD-10-CM

## 2017-12-15 DIAGNOSIS — Z3A37 37 weeks gestation of pregnancy: Secondary | ICD-10-CM | POA: Diagnosis not present

## 2017-12-15 DIAGNOSIS — O3663X0 Maternal care for excessive fetal growth, third trimester, not applicable or unspecified: Secondary | ICD-10-CM | POA: Diagnosis present

## 2017-12-15 DIAGNOSIS — O134 Gestational [pregnancy-induced] hypertension without significant proteinuria, complicating childbirth: Secondary | ICD-10-CM | POA: Diagnosis present

## 2017-12-15 DIAGNOSIS — Z8759 Personal history of other complications of pregnancy, childbirth and the puerperium: Secondary | ICD-10-CM

## 2017-12-15 LAB — CBC
HEMATOCRIT: 32.1 % — AB (ref 35.0–47.0)
Hemoglobin: 11.1 g/dL — ABNORMAL LOW (ref 12.0–16.0)
MCH: 30.2 pg (ref 26.0–34.0)
MCHC: 34.6 g/dL (ref 32.0–36.0)
MCV: 87.1 fL (ref 80.0–100.0)
Platelets: 200 10*3/uL (ref 150–440)
RBC: 3.69 MIL/uL — ABNORMAL LOW (ref 3.80–5.20)
RDW: 14.9 % — AB (ref 11.5–14.5)
WBC: 8.5 10*3/uL (ref 3.6–11.0)

## 2017-12-15 LAB — TYPE AND SCREEN
ABO/RH(D): O POS
ANTIBODY SCREEN: NEGATIVE

## 2017-12-15 MED ORDER — LIDOCAINE-EPINEPHRINE (PF) 1.5 %-1:200000 IJ SOLN
INTRAMUSCULAR | Status: DC | PRN
  Administered 2017-12-15: 3 mL via EPIDURAL

## 2017-12-15 MED ORDER — EPHEDRINE 5 MG/ML INJ: 10.0000 mg | INTRAVENOUS | Status: DC | PRN

## 2017-12-15 MED ORDER — SODIUM CHLORIDE 0.9 % IV SOLN
INTRAVENOUS | Status: DC | PRN
  Administered 2017-12-15 (×2): 5 mL via EPIDURAL

## 2017-12-15 MED ORDER — LIDOCAINE HCL (PF) 1 % IJ SOLN: 30.0000 mL | INTRAMUSCULAR | Status: DC | PRN

## 2017-12-15 MED ORDER — BUTORPHANOL TARTRATE 2 MG/ML IJ SOLN: 1.0000 mg | INTRAMUSCULAR | Status: DC | PRN

## 2017-12-15 MED ORDER — MISOPROSTOL 200 MCG PO TABS
ORAL_TABLET | ORAL | Status: AC
  Administered 2017-12-15: 25 ug via VAGINAL
  Filled 2017-12-15: qty 4

## 2017-12-15 MED ORDER — MISOPROSTOL 25 MCG QUARTER TABLET
25.0000 ug | ORAL_TABLET | ORAL | Status: DC | PRN
  Administered 2017-12-15: 25 ug via VAGINAL
  Filled 2017-12-15: qty 1

## 2017-12-15 MED ORDER — AMMONIA AROMATIC IN INHA
RESPIRATORY_TRACT | Status: AC
  Filled 2017-12-15: qty 10

## 2017-12-15 MED ORDER — LACTATED RINGERS IV SOLN
INTRAVENOUS | Status: DC
  Administered 2017-12-15 – 2017-12-16 (×4): via INTRAVENOUS

## 2017-12-15 MED ORDER — LACTATED RINGERS IV SOLN: 500.0000 mL | INTRAVENOUS | Status: DC | PRN

## 2017-12-15 MED ORDER — LACTATED RINGERS IV SOLN: 500.0000 mL | Freq: Once | INTRAVENOUS | Status: DC

## 2017-12-15 MED ORDER — OXYTOCIN 10 UNIT/ML IJ SOLN
INTRAMUSCULAR | Status: AC
  Filled 2017-12-15: qty 2

## 2017-12-15 MED ORDER — LIDOCAINE HCL (PF) 1 % IJ SOLN
INTRAMUSCULAR | Status: DC | PRN
  Administered 2017-12-15: 3 mL via SUBCUTANEOUS

## 2017-12-15 MED ORDER — PHENYLEPHRINE 40 MCG/ML (10ML) SYRINGE FOR IV PUSH (FOR BLOOD PRESSURE SUPPORT): 80.0000 ug | PREFILLED_SYRINGE | INTRAVENOUS | Status: DC | PRN

## 2017-12-15 MED ORDER — FENTANYL 2.5 MCG/ML W/ROPIVACAINE 0.15% IN NS 100 ML EPIDURAL (ARMC)
EPIDURAL | Status: AC
  Filled 2017-12-15: qty 100

## 2017-12-15 MED ORDER — OXYTOCIN 40 UNITS IN LACTATED RINGERS INFUSION - SIMPLE MED: 2.5000 [IU]/h | INTRAVENOUS | Status: DC

## 2017-12-15 MED ORDER — STERILE WATER FOR INJECTION IJ SOLN
INTRAMUSCULAR | Status: AC
  Filled 2017-12-15: qty 50

## 2017-12-15 MED ORDER — ONDANSETRON HCL 4 MG/2ML IJ SOLN: 4.0000 mg | Freq: Four times a day (QID) | INTRAMUSCULAR | Status: DC | PRN

## 2017-12-15 MED ORDER — TERBUTALINE SULFATE 1 MG/ML IJ SOLN: 0.2500 mg | Freq: Once | INTRAMUSCULAR | Status: DC | PRN

## 2017-12-15 MED ORDER — OXYTOCIN BOLUS FROM INFUSION
500.0000 mL | Freq: Once | INTRAVENOUS | Status: AC
  Administered 2017-12-16: 500 mL via INTRAVENOUS

## 2017-12-15 MED ORDER — FENTANYL 2.5 MCG/ML W/ROPIVACAINE 0.15% IN NS 100 ML EPIDURAL (ARMC)
12.0000 mL/h | EPIDURAL | Status: DC
  Administered 2017-12-15 – 2017-12-16 (×2): 12 mL/h via EPIDURAL
  Filled 2017-12-15: qty 100

## 2017-12-15 MED ORDER — SOD CITRATE-CITRIC ACID 500-334 MG/5ML PO SOLN: 30.0000 mL | ORAL | Status: DC | PRN

## 2017-12-15 MED ORDER — OXYTOCIN 40 UNITS IN LACTATED RINGERS INFUSION - SIMPLE MED
1.0000 m[IU]/min | INTRAVENOUS | Status: DC
  Administered 2017-12-15: 1 m[IU]/min via INTRAVENOUS
  Filled 2017-12-15: qty 1000

## 2017-12-15 MED ORDER — LIDOCAINE HCL (PF) 1 % IJ SOLN
INTRAMUSCULAR | Status: AC
  Filled 2017-12-15: qty 30

## 2017-12-15 MED ORDER — ACETAMINOPHEN 325 MG PO TABS
650.0000 mg | ORAL_TABLET | ORAL | Status: DC | PRN
  Administered 2017-12-16 (×2): 650 mg via ORAL
  Filled 2017-12-15 (×2): qty 2

## 2017-12-15 MED ORDER — DIPHENHYDRAMINE HCL 50 MG/ML IJ SOLN: 12.5000 mg | INTRAMUSCULAR | Status: DC | PRN

## 2017-12-15 NOTE — Anesthesia Procedure Notes (Signed)
Epidural Patient location during procedure: OB Start time: 12/15/2017 10:25 PM End time: 12/15/2017 10:33 PM  Staffing Anesthesiologist: Lenard SimmerKarenz, Jacari Iannello, MD Performed: anesthesiologist   Preanesthetic Checklist Completed: patient identified, site marked, surgical consent, pre-op evaluation, timeout performed, IV checked, risks and benefits discussed and monitors and equipment checked  Epidural Patient position: sitting Prep: ChloraPrep Patient monitoring: heart rate, continuous pulse ox and blood pressure Approach: midline Location: L3-L4 Injection technique: LOR saline  Needle:  Needle type: Tuohy  Needle gauge: 17 G Needle length: 9 cm and 9 Needle insertion depth: 5 cm Catheter type: closed end flexible Catheter size: 19 Gauge Catheter at skin depth: 9.5 cm Test dose: negative and 1.5% lidocaine with Epi 1:200 K  Assessment Sensory level: T10 Events: blood not aspirated, injection not painful, no injection resistance, negative IV test and no paresthesia  Additional Notes 1st attempt Pt. Evaluated and documentation done after procedure finished. Patient identified. Risks/Benefits/Options discussed with patient including but not limited to bleeding, infection, nerve damage, paralysis, failed block, incomplete pain control, headache, blood pressure changes, nausea, vomiting, reactions to medication both or allergic, itching and postpartum back pain. Confirmed with bedside nurse the patient's most recent platelet count. Confirmed with patient that they are not currently taking any anticoagulation, have any bleeding history or any family history of bleeding disorders. Patient expressed understanding and wished to proceed. All questions were answered. Sterile technique was used throughout the entire procedure. Please see nursing notes for vital signs. Test dose was given through epidural catheter and negative prior to continuing to dose epidural or start infusion. Warning signs of high  block given to the patient including shortness of breath, tingling/numbness in hands, complete motor block, or any concerning symptoms with instructions to call for help. Patient was given instructions on fall risk and not to get out of bed. All questions and concerns addressed with instructions to call with any issues or inadequate analgesia.   Patient tolerated the insertion well without immediate complications.Reason for block:procedure for pain

## 2017-12-15 NOTE — Progress Notes (Signed)
   Subjective:  Comfortable with epidural in place  Objective:   Vitals: Blood pressure 134/81, pulse (!) 101, temperature 98.6 F (37 C), temperature source Oral, resp. rate 18, height 5\' 5"  (1.651 m), weight 198 lb (89.8 kg), last menstrual period 03/31/2017, SpO2 100 %, unknown if currently breastfeeding. General: NAD Abdomen: gravid, non-tender Cervical Exam:  Dilation: 4 Effacement (%): 70 Cervical Position: Middle Station: -1 Presentation: Vertex Exam by:: Sheala Dosh MD  FHT: 140, moderate, no accels no decels Toco: q1-455min  Results for orders placed or performed during the hospital encounter of 12/15/17 (from the past 24 hour(s))  CBC     Status: Abnormal   Collection Time: 12/15/17  1:45 PM  Result Value Ref Range   WBC 8.5 3.6 - 11.0 K/uL   RBC 3.69 (L) 3.80 - 5.20 MIL/uL   Hemoglobin 11.1 (L) 12.0 - 16.0 g/dL   HCT 36.632.1 (L) 44.035.0 - 34.747.0 %   MCV 87.1 80.0 - 100.0 fL   MCH 30.2 26.0 - 34.0 pg   MCHC 34.6 32.0 - 36.0 g/dL   RDW 42.514.9 (H) 95.611.5 - 38.714.5 %   Platelets 200 150 - 440 K/uL  Type and screen     Status: None   Collection Time: 12/15/17  2:49 PM  Result Value Ref Range   ABO/RH(D) O POS    Antibody Screen NEG    Sample Expiration      12/18/2017 Performed at [facility], [address] [place], [place] [address]     Assessment:   27 y.o. G2P0100 176w0d IOL history of 36 week IUF, polyhydramnios and accelerated growth this pregnancy  Plan:   1) Labor - pitocin, starting to transition into active labor  2) Fetus - cat I tracing  3) Tachyardia post epidural - currently 110's.  BP normotensive, normal O2 sat, and no vaginal bleeding  Vena AustriaAndreas Geana Walts, MD, Merlinda FrederickFACOG [facility], [facility] 12/15/2017, 11:42 PM

## 2017-12-15 NOTE — H&P (Signed)
Obstetric H&P   Chief Complaint: Scheduled induction  Prenatal Care Provider: [facility]  History of Present Illness: 27 y.o. G2P0100 43w0dby 01/05/2018, by Last Menstrual Period = 8 week [place]ing to L&D for scheduled induction of labor secondary to history of IUFD, accelerated fetal growth and polyhydramnios.  No LOF, no VB, no ctx.    Pregravid weight 140 lb (63.5 kg) Total Weight Gain 60 lb (27.2 kg)  Pregnancy#2 Problems (from 03/31/17 to present)    Problem Noted Resolved   History of intrauterine fetal death in previous pregnancy 112-20-2018by SMalachy Mood MD No   Overview Addendum 12/12/2017  2:08 PM by GDalia Heading CNM    Negative microarray, Torch titers other than HSV, and hypercoagulable work up Stillbirth thought to be due to silent maternal fetal transfusion      Supervision of high risk pregnancy, antepartum 05/16/2017 by SMalachy Mood MD No   Overview Addendum 12/12/2017  2:13 PM by GDalia Heading CLake GeorgePrenatal Labs  Dating LMP=8week [place]oreaBlood type: O/Positive/-- (11/26 1504)   Genetic Screen 1 Screen: negative Antibody:Negative (11/26 1504)  Anatomic [place]Normal female Rubella: 1.20 (11/26 1504) Varicella: VI  GTT 91 RPR: Non Reactive (11/26 1504)   Rhogam N/A HBsAg: Negative (11/26 1504)   TDaP vaccine 5/20 HIV: Non Reactive (11/26 1504)   Baby Food reast                     GBS: negative  Contraception  Pap: 08/12/2015 NIL  CBB     CS/VBAC    Support Person Jamal               Review of Systems: 10 point review of systems negative unless otherwise noted in HPI  Past Medical History: Past Medical History:  Diagnosis Date  . History of intrauterine fetal death, currently pregnant     Past Surgical History: Past Surgical History:  Procedure Laterality Date  . TONSILLECTOMY      Past Obstetric History: #: 1, Date: 12/17/15, Sex: Female, Weight: None, GA: 345w5dDelivery: Vaginal, Spontaneous, Apgar1: None,  Apgar5: None, Living: Fetal Demise, Birth Comments: None  #: 2, Date: None, Sex: None, Weight: None, GA: None, Delivery: None, Apgar1: None, Apgar5: None, Living: None, Birth Comments: None   Past Gynecologic History:  Family History: No family history on file.  Social History: Social History   Socioeconomic History  . Marital status: Single    Spouse name: Not on file  . Number of children: Not on file  . Years of education: Not on file  . Highest education level: Not on file  Occupational History  . Not on file  Social Needs  . Financial resource strain: Not on file  . Food insecurity:    Worry: Not on file    Inability: Not on file  . Transportation needs:    Medical: Not on file    Non-medical: Not on file  Tobacco Use  . Smoking status: Never Smoker  . Smokeless tobacco: Never Used  Substance and Sexual Activity  . Alcohol use: No  . Drug use: No  . Sexual activity: Yes  Lifestyle  . Physical activity:    Days per week: Not on file    Minutes per session: Not on file  . Stress: Not on file  Relationships  . Social connections:    Talks on phone: Not on file    Gets together: Not on file  Attends religious service: Not on file    Active member of club or organization: Not on file    Attends meetings of clubs or organizations: Not on file    Relationship status: Not on file  . Intimate partner violence:    Fear of current or ex partner: Not on file    Emotionally abused: Not on file    Physically abused: Not on file    Forced sexual activity: Not on file  Other Topics Concern  . Not on file  Social History Narrative  . Not on file    Medications: Prior to Admission medications   Medication Sig Start Date End Date Taking? Authorizing Provider  ACCU-CHEK FASTCLIX LANCETS MISC 1 Units by Percutaneous route 4 (four) times daily. Patient not taking: Reported on 12/12/2017 11/21/17   Malachy Mood, MD  Blood Glucose Monitoring Suppl (ACCU-CHEK NANO  SMARTVIEW) w/Device KIT 1 kit by Subdermal route as directed. Check blood sugars for fasting, and two hours after breakfast, lunch and dinner (4 checks daily) Patient not taking: Reported on 12/12/2017 11/21/17   Malachy Mood, MD  ferrous sulfate 325 (65 FE) MG EC tablet Take 325 mg by mouth 3 (three) times daily with meals.    [provider]  glucose blood test strip Use as instructed Patient not taking: Reported on 12/12/2017 11/21/17   Malachy Mood, MD  Iron-FA-B Cmp-C-Biot-Probiotic (FUSION PLUS PO) Take 1 tablet by mouth once.    [provider]    Allergies: No Known Allergies  Physical Exam: Vitals: Last menstrual period 03/31/2017, unknown if currently breastfeeding.  FHT: 140, moderate, +accels, no decels Toco: none  General: NAD HEENT: normocephalic, anicteric Pulmonary: No increased work of breathing Cardiovascular: RRR, distal pulses 2+ Abdomen: Gravid, non-tender Leopolds: vtx Genitourinary:  Dilation: Fingertip Effacement (%): 30 Cervical Position: Middle Station: -3 Presentation: Vertex Exam by:: Modena Morrow  Extremities: no edema, erythema, or tenderness Neurologic: Grossly intact Psychiatric: mood appropriate, affect full  Labs: No results found for this or any previous visit (from the past 24 hour(s)).  Assessment: 27 y.o. G2P0100 6w0dby 01/05/2018, by Last Menstrual Period = 8 week [place]oreascheduled IOL history of IUFD at 316weeks, polyhydramnios and accelerated fetal growth noted on antepartum surveillance.    Plan: 1) IOL - proceed with IOL  2) Fetus - cat I tracing  3) PNL - Blood type O/Positive/-- (11/26 1504) / Anti-bodyscreen Negative (11/26 1504) / Rubella 1.20 (11/26 1504) / Varicella Immune / RPR Non Reactive (04/22 1215) / HBsAg Negative (11/26 1504) / HIV Non Reactive (04/22 1215) / 1-hr OGTT 91 / GBS Negative (06/20 1559)  4) Immunization History -  Immunization History  Administered Date(s) Administered  .  Tdap 11/07/2017    5) Disposition - pending delivery  AMalachy Mood MD, FLa [place] C[facility] Dam[facility] 12/15/2017, 1:38 PM

## 2017-12-15 NOTE — Progress Notes (Signed)
   Subjective:  Doing well, feeling increasing contractions  Objective:   Vitals: Blood pressure 128/68, pulse (!) 104, temperature 98.6 F (37 C), temperature source Oral, resp. rate 16, height 5\' 5"  (1.651 m), weight 198 lb (89.8 kg), last menstrual period 03/31/2017, unknown if currently breastfeeding. General:  NAD Abdomen:soft, non-tender Cervical Exam:  Dilation: 3 Effacement (%): 50 Cervical Position: Middle Station: -3, Ballotable Presentation: Vertex Exam by:: Stabler MD  FHT: 145, moderate, +accels, no decels Toco: q372min  Results for orders placed or performed during the hospital encounter of 12/15/17 (from the past 24 hour(s))  CBC     Status: Abnormal   Collection Time: 12/15/17  1:45 PM  Result Value Ref Range   WBC 8.5 3.6 - 11.0 K/uL   RBC 3.69 (L) 3.80 - 5.20 MIL/uL   Hemoglobin 11.1 (L) 12.0 - 16.0 g/dL   HCT 82.932.1 (L) 56.235.0 - 13.047.0 %   MCV 87.1 80.0 - 100.0 fL   MCH 30.2 26.0 - 34.0 pg   MCHC 34.6 32.0 - 36.0 g/dL   RDW 86.514.9 (H) 78.411.5 - 69.614.5 %   Platelets 200 150 - 440 K/uL  Type and screen     Status: None   Collection Time: 12/15/17  2:49 PM  Result Value Ref Range   ABO/RH(D) O POS    Antibody Screen NEG    Sample Expiration      12/18/2017 Performed at [facility], [address] [place], [place] [address]     Assessment:   27 y.o. G2P0100 3760w0d IOL history of IUFDat 36 weeks, polyhydramnios and accelerated growth this pregnancy  Plan:   1) Labor - foley bulb out, switch to pitocin plan on AROM once fetal vtx better applied  2) Fetus - cat I  Vena AustriaAndreas Charmine Bockrath, MD, Merlinda FrederickFACOG [facility], [facility] 12/15/2017, 7:44 PM

## 2017-12-15 NOTE — Progress Notes (Signed)
   Subjective:  Increasing contraction strength  Objective:   Vitals: Blood pressure 128/68, pulse (!) 104, temperature 98.6 F (37 C), temperature source Oral, resp. rate 16, height 5\' 5"  (1.651 m), weight 198 lb (89.8 kg), last menstrual period 03/31/2017, unknown if currently breastfeeding. General: NAD Abdomen: Cervical Exam:  Dilation: 3 Effacement (%): 50 Cervical Position: Middle Station: -3 Presentation: Vertex Exam by:: Hartlee Amedee MD  FHT: 140, moderate, +accels, no decels Toco:q281min  Results for orders placed or performed during the hospital encounter of 12/15/17 (from the past 24 hour(s))  CBC     Status: Abnormal   Collection Time: 12/15/17  1:45 PM  Result Value Ref Range   WBC 8.5 3.6 - 11.0 K/uL   RBC 3.69 (L) 3.80 - 5.20 MIL/uL   Hemoglobin 11.1 (L) 12.0 - 16.0 g/dL   HCT 16.132.1 (L) 09.635.0 - 04.547.0 %   MCV 87.1 80.0 - 100.0 fL   MCH 30.2 26.0 - 34.0 pg   MCHC 34.6 32.0 - 36.0 g/dL   RDW 40.914.9 (H) 81.111.5 - 91.414.5 %   Platelets 200 150 - 440 K/uL  Type and screen     Status: None   Collection Time: 12/15/17  2:49 PM  Result Value Ref Range   ABO/RH(D) O POS    Antibody Screen NEG    Sample Expiration      12/18/2017 Performed at [facility], [address] [place], [place] [address]     Assessment:   27 y.o. G2P0100 5761w0d IOL for history of IUFD at 36 weeks prior pregnancy, polyhydramnios and accelerated growth this pregnancy  Plan:   1) Labor - continue pitocin, slow leak clear fluid  2) Fetus - category I tracing  Vena AustriaAndreas Jacques Willingham, MD, Merlinda FrederickFACOG [facility], [facility] 12/15/2017, 9:39 PM

## 2017-12-15 NOTE — Anesthesia Preprocedure Evaluation (Signed)
Anesthesia Evaluation  Patient identified by MRN, date of birth, ID band Patient awake    Reviewed: Allergy & Precautions, NPO status , Patient's Chart, lab work & pertinent test results, reviewed documented beta blocker date and time   History of Anesthesia Complications Negative for: history of anesthetic complications  Airway Mallampati: II  TM Distance: >3 FB Neck ROM: full    Dental no notable dental hx.    Pulmonary neg pulmonary ROS,           Cardiovascular Exercise Tolerance: Good negative cardio ROS       Neuro/Psych negative neurological ROS  negative psych ROS   GI/Hepatic Neg liver ROS, GERD  ,  Endo/Other  negative endocrine ROS  Renal/GU negative Renal ROS  negative genitourinary   Musculoskeletal   Abdominal   Peds  Hematology negative hematology ROS (+)   Anesthesia Other Findings Past Medical History: No date: History of intrauterine fetal death, currently pregnant   Reproductive/Obstetrics (+) Pregnancy                             Anesthesia Physical  Anesthesia Plan  ASA: II  Anesthesia Plan: Epidural   Post-op Pain Management:    Induction:   PONV Risk Score and Plan:   Airway Management Planned:   Additional Equipment:   Intra-op Plan:   Post-operative Plan:   Informed Consent: I have reviewed the patients History and Physical, chart, labs and discussed the procedure including the risks, benefits and alternatives for the proposed anesthesia with the patient or authorized representative who has indicated his/her understanding and acceptance.     Plan Discussed with: CRNA and Anesthesiologist  Anesthesia Plan Comments:         Anesthesia Quick Evaluation

## 2017-12-16 ENCOUNTER — Encounter: Payer: Commercial Managed Care - PPO | Admitting: Obstetrics and Gynecology

## 2017-12-16 ENCOUNTER — Encounter: Payer: Self-pay | Admitting: *Deleted

## 2017-12-16 ENCOUNTER — Other Ambulatory Visit: Payer: Commercial Managed Care - PPO

## 2017-12-16 DIAGNOSIS — O403XX0 Polyhydramnios, third trimester, not applicable or unspecified: Secondary | ICD-10-CM

## 2017-12-16 DIAGNOSIS — Z3A37 37 weeks gestation of pregnancy: Secondary | ICD-10-CM

## 2017-12-16 LAB — RPR: RPR: NONREACTIVE

## 2017-12-16 MED ORDER — SENNOSIDES-DOCUSATE SODIUM 8.6-50 MG PO TABS
2.0000 | ORAL_TABLET | ORAL | Status: DC
  Administered 2017-12-17 (×2): 2 via ORAL
  Filled 2017-12-16 (×3): qty 2

## 2017-12-16 MED ORDER — ONDANSETRON HCL 4 MG/2ML IJ SOLN: 4.0000 mg | INTRAMUSCULAR | Status: DC | PRN

## 2017-12-16 MED ORDER — ONDANSETRON HCL 4 MG PO TABS: 4.0000 mg | ORAL_TABLET | ORAL | Status: DC | PRN

## 2017-12-16 MED ORDER — OXYCODONE-ACETAMINOPHEN 5-325 MG PO TABS: 1.0000 | ORAL_TABLET | ORAL | Status: DC | PRN

## 2017-12-16 MED ORDER — DIPHENHYDRAMINE HCL 25 MG PO CAPS: 25.0000 mg | ORAL_CAPSULE | Freq: Four times a day (QID) | ORAL | Status: DC | PRN

## 2017-12-16 MED ORDER — SIMETHICONE 80 MG PO CHEW: 80.0000 mg | CHEWABLE_TABLET | ORAL | Status: DC | PRN

## 2017-12-16 MED ORDER — IBUPROFEN 600 MG PO TABS
600.0000 mg | ORAL_TABLET | Freq: Four times a day (QID) | ORAL | Status: DC
  Administered 2017-12-16 – 2017-12-18 (×8): 600 mg via ORAL
  Filled 2017-12-16 (×8): qty 1

## 2017-12-16 MED ORDER — WITCH HAZEL-GLYCERIN EX PADS: 1.0000 "application " | MEDICATED_PAD | CUTANEOUS | Status: DC | PRN

## 2017-12-16 MED ORDER — COCONUT OIL OIL
1.0000 "application " | TOPICAL_OIL | Status: DC | PRN
  Administered 2017-12-18: 1 via TOPICAL
  Filled 2017-12-16: qty 120

## 2017-12-16 MED ORDER — DIBUCAINE 1 % RE OINT: 1.0000 "application " | TOPICAL_OINTMENT | RECTAL | Status: DC | PRN

## 2017-12-16 MED ORDER — OXYCODONE-ACETAMINOPHEN 5-325 MG PO TABS
2.0000 | ORAL_TABLET | ORAL | Status: DC | PRN
Start: 2017-12-16 — End: 2017-12-19

## 2017-12-16 MED ORDER — PRENATAL MULTIVITAMIN CH
1.0000 | ORAL_TABLET | Freq: Every day | ORAL | Status: DC
  Administered 2017-12-16 – 2017-12-18 (×3): 1 via ORAL
  Filled 2017-12-16 (×3): qty 1

## 2017-12-16 MED ORDER — ACETAMINOPHEN 325 MG PO TABS
650.0000 mg | ORAL_TABLET | ORAL | Status: DC | PRN
  Administered 2017-12-16 – 2017-12-17 (×4): 650 mg via ORAL
  Filled 2017-12-16 (×4): qty 2

## 2017-12-16 MED ORDER — BENZOCAINE-MENTHOL 20-0.5 % EX AERO
1.0000 "application " | INHALATION_SPRAY | CUTANEOUS | Status: DC | PRN
  Administered 2017-12-16: 1 via TOPICAL
  Filled 2017-12-16: qty 56

## 2017-12-16 NOTE — H&P (Signed)
   Subjective:  No concerns, did not get much rest overnight can still tell she is having contractions because of pressure  Objective:   Vitals: Blood pressure (!) 108/52, pulse (!) 111, temperature (!) 100.4 F (38 C), temperature source Oral, resp. rate 14, height 5\' 5"  (1.651 m), weight 198 lb (89.8 kg), last menstrual period 03/31/2017, SpO2 98 %, unknown if currently breastfeeding. General: NAD Abdomen: soft, non-tender Cervical Exam:  Dilation: 7 Effacement (%): 80, 90 Cervical Position: Middle Station: 0 Presentation: Vertex Exam by:: Carlos Heber MD  FHT: 150, moderate, +accels, occasional early Toco: q1-802min  Results for orders placed or performed during the hospital encounter of 12/15/17 (from the past 24 hour(s))  CBC     Status: Abnormal   Collection Time: 12/15/17  1:45 PM  Result Value Ref Range   WBC 8.5 3.6 - 11.0 K/uL   RBC 3.69 (L) 3.80 - 5.20 MIL/uL   Hemoglobin 11.1 (L) 12.0 - 16.0 g/dL   HCT 16.132.1 (L) 09.635.0 - 04.547.0 %   MCV 87.1 80.0 - 100.0 fL   MCH 30.2 26.0 - 34.0 pg   MCHC 34.6 32.0 - 36.0 g/dL   RDW 40.914.9 (H) 81.111.5 - 91.414.5 %   Platelets 200 150 - 440 K/uL  Type and screen     Status: None   Collection Time: 12/15/17  2:49 PM  Result Value Ref Range   ABO/RH(D) O POS    Antibody Screen NEG    Sample Expiration      12/18/2017 Performed at [facility], [address] [place], [place] [address]     Assessment:   27 y.o. G2P0100 6095w1d  IOL history of IUFD, and polyhydramnios with accelerated fetal growth  Plan:   1) Labor - no cervical change over last 3-hrs, IUPC placed.  Discussed 3 P's with patient.  Knowing the accelerated growth on ultrasound and discussing the difference in distribution of that weight between an 8lbs preterm baby and an 8lbs term baby we may be looking at cephalo-pelvic disproportion.  Will allow for 2-hr of adequate contractions to see if able to muster any further cervical change.  2) Fetus -  Cat I  3) Temperature  of 100.4 - one time no fetal tachycardia, has not been ruptured for prolonged period.  Monitor if additional temperature above 100.60F start Abx for chorio.  Vena AustriaAndreas Scarlettrose Costilow, MD, Merlinda FrederickFACOG [facility], [facility] 12/16/2017, 6:51 AM

## 2017-12-16 NOTE — Progress Notes (Signed)
   Subjective:  No concerns, comfortable  Objective:   Vitals: Blood pressure (!) 110/58, pulse 97, temperature 98.4 F (36.9 C), temperature source Oral, resp. rate 16, height 5\' 5"  (1.651 m), weight 198 lb (89.8 kg), last menstrual period 03/31/2017, SpO2 100 %, unknown if currently breastfeeding. General: NAD Abdomen: gravid, non-tender Cervical Exam:  Dilation: 6 Effacement (%): 70 Cervical Position: Middle Station: -1 Presentation: Vertex Exam by:: AS  FHT: 140, moderate, +accel, no decels Toco: q875min  Results for orders placed or performed during the hospital encounter of 12/15/17 (from the past 24 hour(s))  CBC     Status: Abnormal   Collection Time: 12/15/17  1:45 PM  Result Value Ref Range   WBC 8.5 3.6 - 11.0 K/uL   RBC 3.69 (L) 3.80 - 5.20 MIL/uL   Hemoglobin 11.1 (L) 12.0 - 16.0 g/dL   HCT 16.132.1 (L) 09.635.0 - 04.547.0 %   MCV 87.1 80.0 - 100.0 fL   MCH 30.2 26.0 - 34.0 pg   MCHC 34.6 32.0 - 36.0 g/dL   RDW 40.914.9 (H) 81.111.5 - 91.414.5 %   Platelets 200 150 - 440 K/uL  Type and screen     Status: None   Collection Time: 12/15/17  2:49 PM  Result Value Ref Range   ABO/RH(D) O POS    Antibody Screen NEG    Sample Expiration      12/18/2017 Performed at [facility], [address] [place], [place] [address]     Assessment:   27 y.o. G2P0100 4611w1d IOL history IUGR with polyhydramnios and accelerated growth  Plan:   1) Labor -continue to titrate pitocin  2) Fetus - cat I tacing  Vena AustriaAndreas Nichola Warren, MD, Merlinda FrederickFACOG [facility], [facility] 12/16/2017, 3:41 AM

## 2017-12-16 NOTE — Progress Notes (Signed)
   Subjective:  Feeling increased pressure  Objective:   Vitals: Blood pressure 112/62, pulse (!) 103, temperature 99 F (37.2 C), temperature source Oral, resp. rate 16, height 5\' 5"  (1.651 m), weight 198 lb (89.8 kg), last menstrual period 03/31/2017, SpO2 98 %, unknown if currently breastfeeding. General:  Abdomen: Cervical Exam:  Dilation: 10 Dilation Complete Date: 12/16/17 Dilation Complete Time: 1149 Effacement (%): 80 Cervical Position: Anterior Station: -1 Presentation: Vertex Exam by:: Janara Klett  FHT: 135, moderate, +accels, occasional variable Toco: q1-442min  Results for orders placed or performed during the hospital encounter of 12/15/17 (from the past 24 hour(s))  CBC     Status: Abnormal   Collection Time: 12/15/17  1:45 PM  Result Value Ref Range   WBC 8.5 3.6 - 11.0 K/uL   RBC 3.69 (L) 3.80 - 5.20 MIL/uL   Hemoglobin 11.1 (L) 12.0 - 16.0 g/dL   HCT 09.832.1 (L) 11.935.0 - 14.747.0 %   MCV 87.1 80.0 - 100.0 fL   MCH 30.2 26.0 - 34.0 pg   MCHC 34.6 32.0 - 36.0 g/dL   RDW 82.914.9 (H) 56.211.5 - 13.014.5 %   Platelets 200 150 - 440 K/uL  RPR     Status: None   Collection Time: 12/15/17  1:45 PM  Result Value Ref Range   RPR Ser Ql Non Reactive Non Reactive  Type and screen     Status: None   Collection Time: 12/15/17  2:49 PM  Result Value Ref Range   ABO/RH(D) O POS    Antibody Screen NEG    Sample Expiration      12/18/2017 Performed at [facility], [address] [place], [place] [address]     Assessment:   27 y.o. 332P0100 6219w1d IOL history of IUFD as well as accelerated growth and polyhydramnios this pregnancy  Plan:   1) Labor - has progressed to complete, will allow to labor down for 30-45 minutes then start pushing - pelvis tested to 6lbs 1oz - growth scan DP last week 7lbs 12oz >97%ile with AC measuring at 5169w0d also >97%ile - 1-hr 91  2) Fetus - cat I tracing  Vena AustriaAndreas Ayliana Casciano, MD, Merlinda FrederickFACOG [facility], [facility] 12/16/2017,  12:16 PM

## 2017-12-17 LAB — CBC
HCT: 30.5 % — ABNORMAL LOW (ref 35.0–47.0)
Hemoglobin: 10.3 g/dL — ABNORMAL LOW (ref 12.0–16.0)
MCH: 29.9 pg (ref 26.0–34.0)
MCHC: 33.8 g/dL (ref 32.0–36.0)
MCV: 88.3 fL (ref 80.0–100.0)
PLATELETS: 175 10*3/uL (ref 150–440)
RBC: 3.45 MIL/uL — ABNORMAL LOW (ref 3.80–5.20)
RDW: 15 % — AB (ref 11.5–14.5)
WBC: 16.2 10*3/uL — ABNORMAL HIGH (ref 3.6–11.0)

## 2017-12-17 NOTE — Progress Notes (Signed)
Patient ID: Cathy [name], female   DOB: [birth year], 27 y.o.   MRN: [id number] Admit Date: 12/15/2017 Today's Date: 12/17/2017  Post Partum Day 1  Subjective:  no complaints, up ad lib, voiding, tolerating PO and + flatus  Objective: Temp:  [97.8 F (36.6 C)-99 F (37.2 C)] 97.8 F (36.6 C) (06/29 0800) Pulse Rate:  [66-140] 79 (06/29 0800) Resp:  [16-20] 18 (06/29 0800) BP: (94-133)/(52-92) 100/64 (06/29 0800) SpO2:  [99 %-100 %] 99 % (06/29 0800)  Physical Exam:  General: alert, cooperative and appears stated age Lochia: appropriate Uterine Fundus: firm DVT Evaluation: No evidence of DVT seen on physical exam.  Recent Labs    12/15/17 1345 12/17/17 0634  HGB 11.1* 10.3*  HCT 32.1* 30.5*    Assessment/Plan: Breastfeeding and Infant doing well  Rh positive. Rubella and varicella immune. Received Tdap vaccination during pregnancy. Plans for no birth control postpartum. Plan for discharge home tomorrow. Recommended continuing home iron supplementation.    LOS: 2 days   Natale Milchhristanna R Kenji Mapel [facility] 12/17/2017, 10:10 AM

## 2017-12-17 NOTE — Lactation Note (Signed)
This note was copied from a baby's chart. Lactation Consultation Note  Patient Name: Cathy [name] ZOXWR'UToday's Date: 12/17/2017 Reason for consult: Initial assessment;Primapara;Early term 3037-38.6wks Assisted mom with positioning with pillow support in football hold on left breast.  Milinda Pointerrie latched easily and began good rhythmic sucking with occasional swallow.  After 5 minutes she started slipping off to tip of nipple and falling asleep.  Re latched several times after that, but no nutritive sucking achieved.  Reviewed supply and demand, normal course of lactation and routine newborn feeding patterns.Explained need for frequently feedings to keep Arie stooling to prevent hyperbilirubinemia since she had (+) coombs.Lactation name and number written on white board and encouraged to call for questions, concerns or assistance.  Maternal Data Formula Feeding for Exclusion: No Has patient been taught Hand Expression?: Yes(Can easily hand express lots of colostrum) Does the patient have breastfeeding experience prior to this delivery?: No(First was stillborn)  Feeding Feeding Type: Breast Fed Length of feed: 5 min  LATCH Score Latch: Repeated attempts needed to sustain latch, nipple held in mouth throughout feeding, stimulation needed to elicit sucking reflex.  Audible Swallowing: A few with stimulation  Type of Nipple: Everted at rest and after stimulation  Comfort (Breast/Nipple): Soft / non-tender  Hold (Positioning): Assistance needed to correctly position infant at breast and maintain latch.  LATCH Score: 7  Interventions Interventions: Breast feeding basics reviewed;Reverse pressure;Assisted with latch;Breast compression;Skin to skin;Adjust position;Breast massage;Support pillows;Hand express;Position options  Lactation Tools Discussed/Used WIC Program: No([employer])   Consult Status Consult Status: Follow-up Date: 12/17/17 Follow-up type: Call as needed    Cathy [name], Cathy Rathel  [name] 12/17/2017, 9:35 AM

## 2017-12-17 NOTE — Anesthesia Postprocedure Evaluation (Signed)
Anesthesia Post Note  Patient: Cathy [name]  Procedure(s) Performed: AN AD HOC LABOR EPIDURAL  Patient location during evaluation: Mother Baby Anesthesia Type: Epidural Level of consciousness: awake and alert Pain management: pain level controlled Vital Signs Assessment: post-procedure vital signs reviewed and stable Respiratory status: spontaneous breathing, nonlabored ventilation and respiratory function stable Cardiovascular status: stable Postop Assessment: no headache, no backache, patient able to bend at knees and able to ambulate Anesthetic complications: no     Last Vitals:  Vitals:   12/17/17 0404 12/17/17 0800  BP: (!) 99/58 100/64  Pulse: 66 79  Resp: 18 18  Temp: 36.7 C 36.6 C  SpO2: 100% 99%    Last Pain:  Vitals:   12/17/17 0800  TempSrc: Oral  PainSc:                  Cleda MccreedyJoseph K Amberlin Utke

## 2017-12-17 NOTE — Lactation Note (Signed)
This note was copied from a baby's chart. Lactation Consultation Note  Patient Name: Cathy [name] ZOXWR'UToday's Date: 12/17/2017 Reason for consult: Follow-up assessment;Early term 37-38.6wks;Primapara;Mother's request Continues to breast feed well with minimal assistance.  Maternal Data Formula Feeding for Exclusion: No  Feeding Feeding Type: Breast Fed Length of feed: 10 min  LATCH Score Latch: Repeated attempts needed to sustain latch, nipple held in mouth throughout feeding, stimulation needed to elicit sucking reflex.  Audible Swallowing: A few with stimulation  Type of Nipple: Everted at rest and after stimulation  Comfort (Breast/Nipple): Soft / non-tender  Hold (Positioning): Assistance needed to correctly position infant at breast and maintain latch.  LATCH Score: 7  Interventions Interventions: Position options;Assisted with latch;Reverse pressure;Breast compression;Support pillows  Lactation Tools Discussed/Used WIC Program: Yes(Mom has [employer] & Milinda Pointerrie will be MCD)   Consult Status Consult Status: Follow-up Follow-up type: Call as needed    Louis MeckelWilliams, [name] 12/17/2017, 7:07 PM

## 2017-12-17 NOTE — Anesthesia Post-op Follow-up Note (Signed)
  Anesthesia Pain Follow-up Note  Patient: Cathy [name]  Day #: 1  Date of Follow-up: 12/17/2017 Time: 8:25 AM  Last Vitals:  Vitals:   12/17/17 0404 12/17/17 0800  BP: (!) 99/58 100/64  Pulse: 66 79  Resp: 18 18  Temp: 36.7 C 36.6 C  SpO2: 100% 99%    Level of Consciousness: alert  Pain: mild   Side Effects:None  Catheter Site Exam:clean, dry     Plan: D/C from anesthesia care at surgeon's request  Cleda MccreedyJoseph K Zosia Lucchese

## 2017-12-18 LAB — COMPREHENSIVE METABOLIC PANEL
ALBUMIN: 2.7 g/dL — AB (ref 3.5–5.0)
ALT: 17 U/L (ref 0–44)
ANION GAP: 8 (ref 5–15)
AST: 28 U/L (ref 15–41)
Alkaline Phosphatase: 77 U/L (ref 38–126)
BILIRUBIN TOTAL: 0.4 mg/dL (ref 0.3–1.2)
BUN: 6 mg/dL (ref 6–20)
CO2: 24 mmol/L (ref 22–32)
Calcium: 8.8 mg/dL — ABNORMAL LOW (ref 8.9–10.3)
Chloride: 109 mmol/L (ref 98–111)
Creatinine, Ser: 0.53 mg/dL (ref 0.44–1.00)
GFR calc Af Amer: >60 mL/min (ref >60)
GFR calc non Af Amer: >60 mL/min (ref >60)
GLUCOSE: 81 mg/dL (ref 70–99)
Potassium: 3.4 mmol/L — ABNORMAL LOW (ref 3.5–5.1)
SODIUM: 141 mmol/L (ref 135–145)
TOTAL PROTEIN: 5.4 g/dL — AB (ref 6.5–8.1)

## 2017-12-18 LAB — CBC
HEMATOCRIT: 29.4 % — AB (ref 35.0–47.0)
Hemoglobin: 10.1 g/dL — ABNORMAL LOW (ref 12.0–16.0)
MCH: 30 pg (ref 26.0–34.0)
MCHC: 34.3 g/dL (ref 32.0–36.0)
MCV: 87.3 fL (ref 80.0–100.0)
Platelets: 193 10*3/uL (ref 150–440)
RBC: 3.36 MIL/uL — ABNORMAL LOW (ref 3.80–5.20)
RDW: 15.1 % — ABNORMAL HIGH (ref 11.5–14.5)
WBC: 10.1 10*3/uL (ref 3.6–11.0)

## 2017-12-18 LAB — PROTEIN / CREATININE RATIO, URINE: Creatinine, Urine: 18 mg/dL

## 2017-12-18 MED ORDER — ACETAMINOPHEN 325 MG PO TABS: 650.0000 mg | ORAL_TABLET | ORAL | 0 refills | Status: AC | PRN

## 2017-12-18 MED ORDER — IBUPROFEN 600 MG PO TABS
600.0000 mg | ORAL_TABLET | Freq: Four times a day (QID) | ORAL | Status: DC
  Administered 2017-12-18: 600 mg via ORAL
  Filled 2017-12-18: qty 1

## 2017-12-18 MED ORDER — IBUPROFEN 600 MG PO TABS: 600.0000 mg | ORAL_TABLET | Freq: Four times a day (QID) | ORAL | 0 refills | Status: DC

## 2017-12-18 MED ORDER — AMLODIPINE BESYLATE 10 MG PO TABS: 10.0000 mg | ORAL_TABLET | Freq: Every day | ORAL | 0 refills | Status: DC

## 2017-12-18 MED ORDER — AMLODIPINE BESYLATE 10 MG PO TABS
10.0000 mg | ORAL_TABLET | Freq: Every day | ORAL | Status: DC
  Administered 2017-12-18: 10 mg via ORAL
  Filled 2017-12-18: qty 1

## 2017-12-18 NOTE — Discharge Instructions (Signed)
Call MD if changing more than 1 peri pad per hour, large, palm size clots, severe depression or wanting to hurt yourself or baby, no intercourse, douches,or  tampons for 6 weeks.

## 2017-12-18 NOTE — Progress Notes (Signed)
Patient ID: Cathy [name], female   DOB: 01-01-1991, 27 y.o.   MRN: [id number] Admit Date: 12/15/2017 Today's Date: 12/18/2017  Post Partum Day 2  Subjective:  no complaints, up ad lib, voiding, tolerating PO and + flatus  Denies headache, denies vision changes, denies right upper quadrant pain.   Objective: Temp:  [97.6 F (36.4 C)-98.8 F (37.1 C)] 98.8 F (37.1 C) (06/30 1125) Pulse Rate:  [92-107] 100 (06/30 1125) Resp:  [16-18] 18 (06/30 1125) BP: (122-138)/(82-100) 137/94 (06/30 1125) SpO2:  [98 %-100 %] 100 % (06/30 1125)  Physical Exam:  General: alert and cooperative Lochia: appropriate Uterine Fundus: firm Incision: none DVT Evaluation: No evidence of DVT seen on physical exam. No significant calf/ankle edema. Neuro: 4+ reflexes with clonus in right foot.   Recent Labs    12/17/17 0634 12/18/17 1129  HGB 10.3* 10.1*  HCT 30.5* 29.4*    Assessment/Plan: 27 yo s/p SVD on 12/16/17 1. Elevated blood pressures, preeclampsia labs pending. Will start on Norvasc 10. 2.  Rh positive, rubella immune, varicella immune.  Tdap received antepartum 3. Does not desire birth control. 4. Breast feeding without problem. Continue to monitor   LOS: 3 days   Christanna R [facility] 12/18/2017, 12:46 PM

## 2017-12-18 NOTE — Progress Notes (Signed)
Patient feeling well. Denies headache, RUQ pain or vision changes. Blood pressures within normal limits with Norvasc. Will have her follow up this week for a blood pressure check in office.  Adelene Idlerhristanna Schuman MD [facility], [facility] 12/18/17 7:45 PM

## 2017-12-18 NOTE — Progress Notes (Signed)
Patient discharged to home. Patient left 3rd floor via w/c with infant in carseat in patient's arms accompanied by husband and Janean SarkMary Lee NT.

## 2017-12-18 NOTE — Progress Notes (Signed)
All doctor's discharge instructions reviewed with patient; she v/u of same; copy given.

## 2017-12-18 NOTE — Discharge Summary (Signed)
OB Discharge Summary     Patient Name: Cathy [name] DOB: [birth year] MRN: [id number]  Date of admission: 12/15/2017 Delivering MD: Homero Fellers, MD  Date of Delivery: 12/15/2017  Date of discharge: 12/18/2017  Admitting diagnosis: 27W Intrauterine pregnancy: [redacted]w[redacted]d    Secondary diagnosis: None     Discharge diagnosis: Term Pregnancy Delivered, Gestational hypertension                         Hospital course:  Onset of Labor With Vaginal Delivery     27y.o. yo G2P1101 at 340w1das admitted in Latent Labor on 12/15/2017. Patient had an uncomplicated labor course as follows:  Membrane Rupture Time/Date: 9:28 PM ,12/15/2017   Intrapartum Procedures: Episiotomy: None [1]                                         Lacerations:  None [1]  Patient had a delivery of a Viable infant. 12/16/2017  Information for the patient's newborn:  LaEbba, [id number]]Delivery Method: CS-LTranv    [name] postpartum course was complicated gestational hypertension, she was started on Norvasc and asked to follow up this week in office for a blood pressure check.  She is ambulating, tolerating a regular diet, passing flatus, and urinating well. Patient is discharged home in stable condition on 12/18/17.                                                                  Post partum procedures:none  Complications: None  Physical exam on 12/18/2017: Vitals:   12/18/17 1325 12/18/17 1500 12/18/17 1700 12/18/17 1928  BP: 140/88 (!) 139/91 (!) 136/95 132/83  Pulse: 95 95 (!) 122 (!) 102  Resp:  '20 18 18  ' Temp:  98.1 F (36.7 C) 98.4 F (36.9 C) 98.3 F (36.8 C)  TempSrc:  Oral Oral Oral  SpO2:  99% 98% 100%  Weight:      Height:       General: alert, cooperative and no distress Lochia: appropriate Uterine Fundus: firm Incision: N/A DVT Evaluation: No evidence of DVT seen on physical exam.  Labs: Lab Results  Component Value Date   WBC 10.1 12/18/2017   HGB 10.1 (L) 12/18/2017   HCT 29.4 (L) 12/18/2017   MCV 87.3 12/18/2017   PLT 193 12/18/2017   CMP Latest Ref Rng & Units 12/18/2017  Glucose 70 - 99 mg/dL 81  BUN 6 - 20 mg/dL 6  Creatinine 0.44 - 1.00 mg/dL 0.53  Sodium 135 - 145 mmol/L 141  Potassium 3.5 - 5.1 mmol/L 3.4(L)  Chloride 98 - 111 mmol/L 109  CO2 22 - 32 mmol/L 24  Calcium 8.9 - 10.3 mg/dL 8.8(L)  Total Protein 6.5 - 8.1 g/dL 5.4(L)  Total Bilirubin 0.3 - 1.2 mg/dL 0.4  Alkaline Phos 38 - 126 U/L 77  AST 15 - 41 U/L 28  ALT 0 - 44 U/L 17    Discharge instruction: per After Visit Summary.  Medications:  Allergies as of 12/18/2017   No Known Allergies     Medication List    TAKE these medications   ACCU-CHEK  FASTCLIX LANCETS Misc 1 Units by Percutaneous route 4 (four) times daily.   ACCU-CHEK NANO SMARTVIEW w/Device Kit 1 kit by Subdermal route as directed. Check blood sugars for fasting, and two hours after breakfast, lunch and dinner (4 checks daily)   acetaminophen 325 MG tablet Commonly known as:  TYLENOL Take 2 tablets (650 mg total) by mouth every 4 (four) hours as needed (for pain scale < 4).   amLODipine 10 MG tablet Commonly known as:  NORVASC Take 1 tablet (10 mg total) by mouth daily. Start taking on:  12/19/2017   ferrous sulfate 325 (65 FE) MG EC tablet Take 325 mg by mouth 3 (three) times daily with meals.   FUSION PLUS PO Take 1 tablet by mouth once.   glucose blood test strip Use as instructed   ibuprofen 600 MG tablet Commonly known as:  ADVIL,MOTRIN Take 1 tablet (600 mg total) by mouth every 6 (six) hours. Start taking on:  12/19/2017   multivitamin-prenatal 27-0.8 MG Tabs tablet Take 1 tablet by mouth daily at 12 noon.       Diet: routine diet  Activity: Advance as tolerated. Pelvic rest for 6 weeks.   Outpatient follow up: Follow-up Information    [facility]. Schedule an appointment as soon as possible for a visit in 3 day(s).   Why:  please call to make an appointment for a blood  pressure check. Contact information: [address] [place] [address] [phone]            Postpartum contraception: None Rhogam Given postpartum: no Rubella vaccine given postpartum: no Varicella vaccine given postpartum: no TDaP given antepartum or postpartum: No  Newborn Data: Live born female  Birth Weight: 7 lb 10.4 oz (3470 g) APGAR: 9, 9  Newborn Delivery   Birth date/time:  12/16/2017 13:52:00 Delivery type:  Vaginal, Spontaneous    Name: Cathy [name]  Baby Feeding: Breast  Disposition:home with mother  SIGNED:  Homero Fellers, MD 12/18/2017 7:55 PM

## 2017-12-18 NOTE — Plan of Care (Signed)
Patient discharged to home

## 2017-12-18 NOTE — Lactation Note (Signed)
This note was copied from a baby's chart. Lactation Consultation Note  Patient Name: Cathy [name] TJQZE'S Date: 12/18/2017 Reason for consult: Follow-up assessment;Early term 37-38.6wks;Nipple pain/trauma;Primapara;Mother's request Mom's breasts are getting firm and uncomfortable.  Cathy [name] is cluster feeding.  Mom struggling to get her to latch and remain latched for this feeding.  Demonstrated massaging of breasts and hand expressing to soften areola for Cathy [name] to get deeper latch.  Cathy [name] was able to latch and then began good rhythmic sucking with lots of swallows.  She fell asleep after 5 minutes and refused to continue to wake and suck any further.  Mom reports that less than 2 hrs ago she fed for 35 minutes.  Within an hour, she was awake again and rooting for the breast and went back for 10 minutes.  Mom requesting to pump because still feeling full.  Set up Symphony in room.  Mom pumped bilaterally and got 17 ml transitional breast milk from each breast.  Reviewed pumping, cleaning, collection, storage, labeling and handling of breast milk.  Demonstrated how FOB could finger feed with curved tip syringe, but mom decided to wait and just put baby to breast for next feeding since she had gone back to sleep in Dad's arms now.  Mom reports being on her mother's insurance, but not sure she had maternity.  She plans for Cathy [name] to be on MCD.  Discussed process of getting DEBP through [facility] which is her mother's insurance.  Mom reports already being offered a Medela DEBP from her friend.  Demonstrated how to use her double electric breast pump kit with her friends' pump.  Reviewed importance of continuing to put Arie to the breast to drain breasts and prevent engorgement.  Encouraged mom to call lactation number on white board with any questions, concerns or assistance.  Maternal Data Formula Feeding for Exclusion: No Has patient been taught Hand Expression?: Yes(Can easily hand express ) Does the patient have  breastfeeding experience prior to this delivery?: No  Feeding Feeding Type: Breast Fed Length of feed: 15 min  LATCH Score Latch: Grasps breast easily, tongue down, lips flanged, rhythmical sucking.  Audible Swallowing: Spontaneous and intermittent  Type of Nipple: Everted at rest and after stimulation  Comfort (Breast/Nipple): Filling, red/small blisters or bruises, mild/mod discomfort  Hold (Positioning): Assistance needed to correctly position infant at breast and maintain latch.  LATCH Score: 8  Interventions Interventions: Reverse pressure;Coconut oil;Assisted with latch;Breast compression;Breast massage;Support pillows;Hand express;Position options;DEBP;Expressed milk  Lactation Tools Discussed/Used Tools: Pump Breast pump type: Double-Electric Breast Pump WIC Program: Yes(Also has [employer] through mom for her & baby will be on MCD) Pump Review: Setup, frequency, and cleaning;Milk Storage;Other (comment)   Consult Status Consult Status: PRN Follow-up type: Call as needed    Jarold Motto 12/18/2017, 5:33 PM

## 2017-12-21 ENCOUNTER — Encounter: Payer: Self-pay | Admitting: Obstetrics and Gynecology

## 2017-12-21 ENCOUNTER — Ambulatory Visit (INDEPENDENT_AMBULATORY_CARE_PROVIDER_SITE_OTHER): Payer: Commercial Managed Care - PPO | Admitting: Obstetrics and Gynecology

## 2017-12-21 VITALS — BP 140/88 | HR 111 | Ht 65.0 in | Wt 183.0 lb

## 2017-12-21 DIAGNOSIS — O99810 Abnormal glucose complicating pregnancy: Secondary | ICD-10-CM

## 2017-12-21 DIAGNOSIS — O99815 Abnormal glucose complicating the puerperium: Secondary | ICD-10-CM

## 2017-12-21 NOTE — Progress Notes (Signed)
  OBSTETRICS POSTPARTUM CLINIC PROGRESS NOTE  Subjective:     Cathy [name] is a 27 y.o. 382P1101 female who presents for a postpartum visit. She is 1 week postpartum following a Term pregnancy and delivery by Vaginal, no problems at delivery.  I have fully reviewed the prenatal and intrapartum course. Postpartum course has been complicated by complicated by hypertension.  Baby is feeding by Breast.    She denies headache, vision changes or RUQ pain. She has has continued swelling of her feet bilaterally.  The following portions of the patient's history were reviewed and updated as appropriate: allergies, current medications, past family history, past medical history, past social history, past surgical history and problem list.  Review of Systems Pertinent items are noted in HPI.  Objective:    BP 140/88 (BP Location: Left Arm, Patient Position: Sitting, Cuff Size: Normal)   Pulse (!) 111   Ht 5\' 5"  (1.651 m)   Wt 183 lb (83 kg)   LMP 03/31/2017   Breastfeeding? Yes   BMI 30.45 kg/m   General:  alert and no distress   Breasts:  inspection negative, no nipple discharge or bleeding, no masses or nodularity palpable  Lungs: clear to auscultation bilaterally  Heart:  regular rate and rhythm, S1, S2 normal, no murmur, click, rub or gallop  Abdomen: soft, non-tender; bowel sounds normal; no masses,  no organomegaly.   Vulva:  normal  Vagina: normal vagina, no discharge, exudate, lesion, or erythema  Cervix:  no cervical motion tenderness and no lesions  Corpus: normal size, contour, position, consistency, mobility, non-tender  Adnexa:  normal adnexa and no mass, fullness, tenderness  Rectal Exam: Not performed.        Bilateral swelling on her ankles and feet, 1+, no pitting edema.  Assessment:  Post Partum Care visit There are no diagnoses linked to this encounter.  Plan:  See orders and Patient Instructions  Patient will continue taking norvasc 10mg  daily. She did not take it  this morning, her usual dosage time is in the afternoon. Discussed warning signs of preeclampsia.   Follow up in: 1 week or as needed.   Adelene Idlerhristanna  MD [facility], [facility] 12/21/17 10:19 AM

## 2017-12-23 ENCOUNTER — Encounter: Payer: Self-pay | Admitting: Obstetrics and Gynecology

## 2017-12-23 NOTE — Telephone Encounter (Signed)
I don't think this patient wants my advise, which is to continue to take the blood pressure medication and come back in 1 week. It sounds like she wants Dr. Ramiro HarvestStaebler's expertise.  It also sounds like the Norvasc is controlling her blood pressures which were elevated postpartum.  -Dr. Jerene PitchSchuman

## 2017-12-30 ENCOUNTER — Ambulatory Visit (INDEPENDENT_AMBULATORY_CARE_PROVIDER_SITE_OTHER): Payer: Commercial Managed Care - PPO | Admitting: Obstetrics and Gynecology

## 2017-12-30 ENCOUNTER — Encounter: Payer: Self-pay | Admitting: Obstetrics and Gynecology

## 2017-12-30 VITALS — BP 136/92 | HR 116 | Wt 170.0 lb

## 2017-12-30 DIAGNOSIS — Z9889 Other specified postprocedural states: Secondary | ICD-10-CM

## 2017-12-30 DIAGNOSIS — O1495 Unspecified pre-eclampsia, complicating the puerperium: Secondary | ICD-10-CM

## 2017-12-30 NOTE — Progress Notes (Signed)
Obstetrics & Gynecology Office Visit   Chief Complaint:  Chief Complaint  Patient presents with  . Postpartum Care    B/P check    History of Present Illness: 27 y.o. G2P1101 being seen for follow up blood pressure check today.  The patient is not pregnantThe established diagnosis for the patient is preeclampsia without severe features.  She is currently on amlodipine 10mg .  She reports no current symptoms attributable to her blood pressure.  Medication list reviewed no medications contraindicated for use in patient with current hypertension were noted.  Review of Systems: 10 point review of systems negative unless otherwise noted in HPI  Past Medical History:  Past Medical History:  Diagnosis Date  . History of intrauterine fetal death, currently pregnant     Past Surgical History:  Past Surgical History:  Procedure Laterality Date  . TONSILLECTOMY      Gynecologic History: No LMP recorded.  Obstetric History: G2P1101  Family History:  No family history on file.  Social History:  Social History   Socioeconomic History  . Marital status: Single    Spouse name: Not on file  . Number of children: Not on file  . Years of education: Not on file  . Highest education level: Not on file  Occupational History  . Not on file  Social Needs  . Financial resource strain: Not on file  . Food insecurity:    Worry: Not on file    Inability: Not on file  . Transportation needs:    Medical: Not on file    Non-medical: Not on file  Tobacco Use  . Smoking status: Never Smoker  . Smokeless tobacco: Never Used  Substance and Sexual Activity  . Alcohol use: No  . Drug use: No  . Sexual activity: Yes  Lifestyle  . Physical activity:    Days per week: Not on file    Minutes per session: Not on file  . Stress: Not on file  Relationships  . Social connections:    Talks on phone: Not on file    Gets together: Not on file    Attends religious service: Not on file   Active member of club or organization: Not on file    Attends meetings of clubs or organizations: Not on file    Relationship status: Not on file  . Intimate partner violence:    Fear of current or ex partner: Not on file    Emotionally abused: Not on file    Physically abused: Not on file    Forced sexual activity: Not on file  Other Topics Concern  . Not on file  Social History Narrative  . Not on file    Allergies:  No Known Allergies  Medications: Prior to Admission medications   Medication Sig Start Date End Date Taking? Authorizing Provider  acetaminophen (TYLENOL) 325 MG tablet Take 2 tablets (650 mg total) by mouth every 4 (four) hours as needed (for pain scale < 4). 12/18/17  Yes Schuman, Christanna R, MD  amLODipine (NORVASC) 10 MG tablet Take 1 tablet (10 mg total) by mouth daily. 12/19/17  Yes Schuman, Christanna R, MD  ferrous sulfate 325 (65 FE) MG EC tablet Take 325 mg by mouth 3 (three) times daily with meals.   Yes [provider]  ibuprofen (ADVIL,MOTRIN) 600 MG tablet Take 1 tablet (600 mg total) by mouth every 6 (six) hours. 12/19/17  Yes Schuman, Jaquelyn Bitter, MD  Prenatal Vit-Fe Fumarate-FA (MULTIVITAMIN-PRENATAL) 27-0.8 MG  TABS tablet Take 1 tablet by mouth daily at 12 noon.   Yes [provider]    Physical Exam Blood pressure (!) 136/92, pulse (!) 116, weight 170 lb (77.1 kg), currently breastfeeding.  No LMP recorded.  General: NAD HEENT: normocephalic, anicteric Pulmonary: No increased work of breathing Cardiovascular: RRR, distal pulses 2+ Extremities: noedema, no erythema, no tenderness Neurologic: Grossly intact Psychiatric: mood appropriate, affect full  Assessment: 27 y.o. [id number] presenting for blood pressure evaluation today  Plan: Problem List Items Addressed This Visit    None    Visit Diagnoses    Preeclampsia in postpartum period    -  Primary      1) Blood pressure - blood pressure at today's visit is elevated.  As  a result contine amlodpine at current dose. - additional blood work was not obtained   2) Return in about 1 week (around 01/06/2018) for BP check.   Vena AustriaAndreas Abhinav Mayorquin, MD, Evern CoreFACOG [facility], [facility] 12/30/2017, 11:17 AM

## 2018-01-06 ENCOUNTER — Encounter: Payer: Self-pay | Admitting: Obstetrics and Gynecology

## 2018-01-06 ENCOUNTER — Ambulatory Visit (INDEPENDENT_AMBULATORY_CARE_PROVIDER_SITE_OTHER): Payer: Commercial Managed Care - PPO | Admitting: Obstetrics and Gynecology

## 2018-01-06 VITALS — BP 136/84 | HR 100 | Wt 167.0 lb

## 2018-01-06 DIAGNOSIS — Z0130 Encounter for examination of blood pressure without abnormal findings: Secondary | ICD-10-CM

## 2018-01-06 NOTE — Progress Notes (Signed)
Obstetrics & Gynecology Office Visit   Chief Complaint:  Chief Complaint  Patient presents with  . Postpartum Care    vaginal delivery 6/28 B/P check    History of Present Illness: 27 y.o. G2P1101 being seen for follow up blood pressure check today.  The patient is postpartumThe established diagnosis for the patient is gestational hypertension.  She is currently on amlodipine 10mg .  She reports no current symptoms attributable to her blood pressure.  Medication list reviewed no medications contraindicated for use in patient with current hypertension were noted.  Review of Systems: Review of Systems  Constitutional: Negative.   Neurological: Negative.     Past Medical History:  Past Medical History:  Diagnosis Date  . History of intrauterine fetal death, currently pregnant     Past Surgical History:  Past Surgical History:  Procedure Laterality Date  . TONSILLECTOMY      Gynecologic History: No LMP recorded.  Obstetric History: [id number]  Family History:  History reviewed. No pertinent family history.  Social History:  Social History   Socioeconomic History  . Marital status: Single    Spouse name: Not on file  . Number of children: Not on file  . Years of education: Not on file  . Highest education level: Not on file  Occupational History  . Not on file  Social Needs  . Financial resource strain: Not on file  . Food insecurity:    Worry: Not on file    Inability: Not on file  . Transportation needs:    Medical: Not on file    Non-medical: Not on file  Tobacco Use  . Smoking status: Never Smoker  . Smokeless tobacco: Never Used  Substance and Sexual Activity  . Alcohol use: No  . Drug use: No  . Sexual activity: Yes  Lifestyle  . Physical activity:    Days per week: Not on file    Minutes per session: Not on file  . Stress: Not on file  Relationships  . Social connections:    Talks on phone: Not on file    Gets together: Not on file   Attends religious service: Not on file    Active member of club or organization: Not on file    Attends meetings of clubs or organizations: Not on file    Relationship status: Not on file  . Intimate partner violence:    Fear of current or ex partner: Not on file    Emotionally abused: Not on file    Physically abused: Not on file    Forced sexual activity: Not on file  Other Topics Concern  . Not on file  Social History Narrative  . Not on file    Allergies:  No Known Allergies  Medications: Prior to Admission medications   Medication Sig Start Date End Date Taking? Authorizing Provider  acetaminophen (TYLENOL) 325 MG tablet Take 2 tablets (650 mg total) by mouth every 4 (four) hours as needed (for pain scale < 4). 12/18/17   Schuman, Christanna R, MD  amLODipine (NORVASC) 10 MG tablet Take 1 tablet (10 mg total) by mouth daily. 12/19/17   Schuman, Jaquelyn Bitter, MD  ferrous sulfate 325 (65 FE) MG EC tablet Take 325 mg by mouth 3 (three) times daily with meals.    [provider]  ibuprofen (ADVIL,MOTRIN) 600 MG tablet Take 1 tablet (600 mg total) by mouth every 6 (six) hours. 12/19/17   Natale Milch, MD  Prenatal Vit-Fe Vernie Ammons (  MULTIVITAMIN-PRENATAL) 27-0.8 MG TABS tablet Take 1 tablet by mouth daily at 12 noon.    [provider]    Physical Exam Blood pressure 136/84, pulse 100, weight 167 lb (75.8 kg), currently breastfeeding.  No LMP recorded.  General: NAD HEENT: normocephalic, anicteric Pulmonary: No increased work of breathing Extremities: noedema, no erythema, no tenderness Neurologic: Grossly intact Psychiatric: mood appropriate, affect full  Assessment: 27 y.o. [id number] presenting for blood pressure evaluation today  Plan: Problem List Items Addressed This Visit    None      1) Blood pressure - blood pressure at today's visit is normotensive.  As a result discontinue antihypertensive therapy. - additional blood work was not  obtained  2) Contraception - bottle feeding, discussed contraceptive options including OCP, Depo prover, Nexplanon, and IUD  3) Return in about 1 month (around 02/03/2018) for 6 week postpartum.   Vena AustriaAndreas Siris Hoos, MD, Evern CoreFACOG [facility], [facility] 01/06/2018, 10:27 AM

## 2018-02-01 ENCOUNTER — Ambulatory Visit: Payer: Commercial Managed Care - PPO | Admitting: Obstetrics and Gynecology

## 2018-02-07 ENCOUNTER — Ambulatory Visit (INDEPENDENT_AMBULATORY_CARE_PROVIDER_SITE_OTHER): Payer: Commercial Managed Care - PPO | Admitting: Obstetrics and Gynecology

## 2018-02-07 ENCOUNTER — Encounter: Payer: Self-pay | Admitting: Obstetrics and Gynecology

## 2018-02-07 NOTE — Progress Notes (Signed)
Postpartum Visit  Chief Complaint:  Chief Complaint  Patient presents with  . Postpartum Care    Vaginal delivery 6/28    History of Present Illness: Patient is a 27 y.o. G2P1101 presents for postpartum visit.  Date of delivery: 12/16/2017 Type of delivery: Vaginal delivery - Vacuum or forceps assisted  no Episiotomy No.  Laceration: no  Pregnancy or labor problems:  Yes polyhdramnios, GHTN Any problems since the delivery:  no  Newborn Details:  SINGLETON :  1. BabyGender female. Birth weight: 7lbs 10oz Maternal Details:  Breast or formula feeding: plans to breastfeed Contraception after delivery: No  Any bowel or bladder issues: No  Post partum depression/anxiety noted:  no Edinburgh Post-Partum Depression Score:0 Date of last PAP: 08/12/2015  no abnormalities   Review of Systems: Review of Systems  Constitutional: Negative.   Gastrointestinal: Negative.   Genitourinary: Negative.   Skin: Negative.   Psychiatric/Behavioral: Negative.     The following portions of the patient's history were reviewed and updated as appropriate: allergies, current medications, past family history, past medical history, past social history, past surgical history and problem list.  Past Medical History:  Past Medical History:  Diagnosis Date  . History of intrauterine fetal death, currently pregnant     Past Surgical History:  Past Surgical History:  Procedure Laterality Date  . TONSILLECTOMY      Family History:  History reviewed. No pertinent family history.  Social History:  Social History   Socioeconomic History  . Marital status: Single    Spouse name: Not on file  . Number of children: Not on file  . Years of education: Not on file  . Highest education level: Not on file  Occupational History  . Not on file  Social Needs  . Financial resource strain: Not on file  . Food insecurity:    Worry: Not on file    Inability: Not on file  . Transportation needs:      Medical: Not on file    Non-medical: Not on file  Tobacco Use  . Smoking status: Never Smoker  . Smokeless tobacco: Never Used  Substance and Sexual Activity  . Alcohol use: No  . Drug use: No  . Sexual activity: Yes  Lifestyle  . Physical activity:    Days per week: Not on file    Minutes per session: Not on file  . Stress: Not on file  Relationships  . Social connections:    Talks on phone: Not on file    Gets together: Not on file    Attends religious service: Not on file    Active member of club or organization: Not on file    Attends meetings of clubs or organizations: Not on file    Relationship status: Not on file  . Intimate partner violence:    Fear of current or ex partner: Not on file    Emotionally abused: Not on file    Physically abused: Not on file    Forced sexual activity: Not on file  Other Topics Concern  . Not on file  Social History Narrative  . Not on file    Allergies:  No Known Allergies  Medications: Prior to Admission medications   Medication Sig Start Date End Date Taking? Authorizing Provider  acetaminophen (TYLENOL) 325 MG tablet Take 2 tablets (650 mg total) by mouth every 4 (four) hours as needed (for pain scale < 4). 12/18/17   Schuman, Jaquelyn Bitterhristanna R, MD  amLODipine (  NORVASC) 10 MG tablet Take 1 tablet (10 mg total) by mouth daily. 12/19/17   Schuman, Jaquelyn Bitterhristanna R, MD  ferrous sulfate 325 (65 FE) MG EC tablet Take 325 mg by mouth 3 (three) times daily with meals.    [provider]  ibuprofen (ADVIL,MOTRIN) 600 MG tablet Take 1 tablet (600 mg total) by mouth every 6 (six) hours. 12/19/17   Schuman, Jaquelyn Bitterhristanna R, MD  Prenatal Vit-Fe Fumarate-FA (MULTIVITAMIN-PRENATAL) 27-0.8 MG TABS tablet Take 1 tablet by mouth daily at 12 noon.    [provider]    Physical Exam Blood pressure 132/88, pulse 95, height 5\' 5"  (1.651 m), weight 165 lb (74.8 kg), last menstrual period 02/03/2018, not currently breastfeeding.    General:  NAD HEENT: normocephalic, anicteric Pulmonary: No increased work of breathing Abdomen: NABS, soft, non-tender, non-distended.  Umbilicus without lesions.  No hepatomegaly, splenomegaly or masses palpable. No evidence of hernia. Genitourinary:  External: Normal external female genitalia.  Normal urethral meatus, normal  Bartholin's and Skene's glands.    Vagina: Normal vaginal mucosa, no evidence of prolapse.    Cervix: Grossly normal in appearance, no bleeding  Uterus: Non-enlarged, mobile, normal contour.  No CMT  Adnexa: ovaries non-enlarged, no adnexal masses  Rectal: deferred Extremities: no edema, erythema, or tenderness Neurologic: Grossly intact Psychiatric: mood appropriate, affect full    Assessment: 27 y.o. G2P1101 presenting for 6 week postpartum visit  Plan: Problem List Items Addressed This Visit    None       1) Contraception - Education given regarding options for contraception, as well as compatibility with breast feeding if applicable.  Patient plans on condoms for contraception.  2)  Pap - ASCCP guidelines and rational discussed.  ASCCP guidelines and rational discussed.  Patient opts for every 3 years screening interval  3) Patient underwent screening for postpartum depression with no signs of depression  4) No follow-ups on file.   Vena AustriaAndreas Rosalie Gelpi, MD, Evern CoreFACOG [facility], [facility] 02/07/2018, 2:37 PM

## 2018-02-14 ENCOUNTER — Other Ambulatory Visit: Payer: Self-pay | Admitting: Obstetrics and Gynecology

## 2018-02-28 ENCOUNTER — Telehealth: Payer: Self-pay

## 2018-02-28 NOTE — Telephone Encounter (Signed)
Pt delivered 12/16/17. Pt returned to work today & needs a Copywriter, advertising to work note. Ok to send thru [place]. TA#[phone]

## 2019-01-18 IMAGING — US US MFM OB DETAIL+14 WK
1 series · 12 of 28 positions shown · non-contrast
Comparison: none

PATIENT INFO:

PERFORMED BY:
SERVICE(S) PROVIDED:
INDICATIONS:
35 weeks gestation of pregnancy
Polyhydramnios
Macrosomia
History fetal demise
FETAL EVALUATION:
Num Of Fetuses:     1
Preg. Location:     Mid
Fetal Heart         152
Rate(bpm):
Cardiac Activity:   Present
Presentation:       Vertex
Placenta:           Fundal, No previa
Amniotic Fluid
AFI FV:      Subjectively increased
AFI Sum(cm)     %Tile       Largest Pocket(cm)
25.4            95
BIOMETRY:
BPD:        94  mm     G. Age:  38w 2d         98  %    CI:         77.1   %    70 - 86
FL/HC:      21.5   %    20.1 -
HC:       339   mm     G. Age:  39w 0d         91  %    HC/AC:      0.97        0.93 -
AC:       351   mm     G. Age:  39w 0d       > 97  %    FL/BPD:     77.7   %    71 - 87
FL:         73  mm     G. Age:  37w 3d         85  %    FL/AC:      20.8   %    20 - 24
Est. FW:    3999  gm    7 lb 12 oz    > 97  %
GESTATIONAL AGE:
LMP:           35w 4d        Date:  03/31/17                 EDD:   01/05/18
U/S Today:     38w 3d                                        EDD:   12/16/17
Best:          35w 4d     Det. By:  LMP  (03/31/17)          EDD:   01/05/18
ANATOMY:
Cranium:               Normal appearance      Aortic Arch:            Normal appearance
Cavum:                 Normal appearance      Ductal Arch:            Normal appearance
Ventricles:            Normal appearance      Diaphragm:              Within Normal Limits
Choroid Plexus:        Within Normal Limits   Stomach:                Seen
Cerebellum:            Within Normal Limits   Abdomen:                Normal appearance
Posterior Fossa:       Suboptimal             Abdominal Wall:         Normal appearance
Nuchal Fold:           Beyond 22 weeks        Cord Vessels:           Within Normal
gestation                                      Limits, 3 vessels
Face:                  Within Normal Limits   Kidneys:                Normal appearance
Lips:                  Normal appearance      Bladder:                Seen
Thoracic:              Normal appearance      Spine:                  Normal appearance
Heart:                 Normal appearance      Upper Extremities:      Suboptimally
visualized
RVOT:                  Normal appearance      Lower Extremities:      Suboptimally
LVOT:                  Normal appearance
CERVIX UTERUS ADNEXA:
Adnexa:       Normal appearance

[Series 1: us mfm ob detail+14 wk · 12 of 83 slices shown]
[im 4/83]
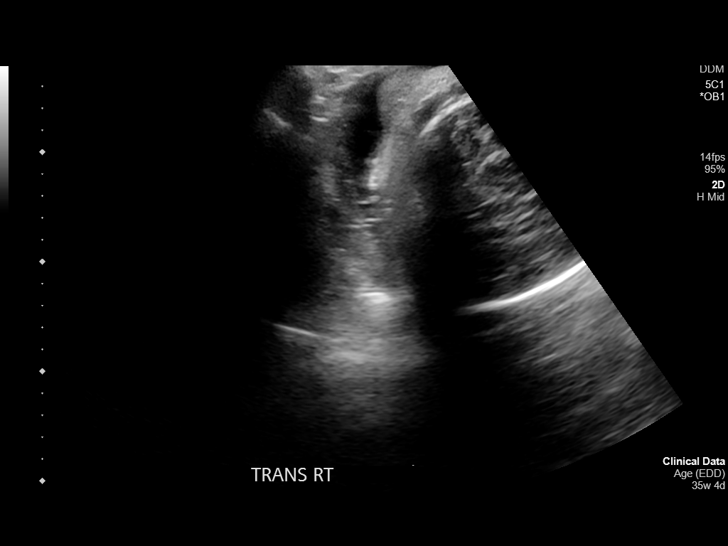
[im 10/83]
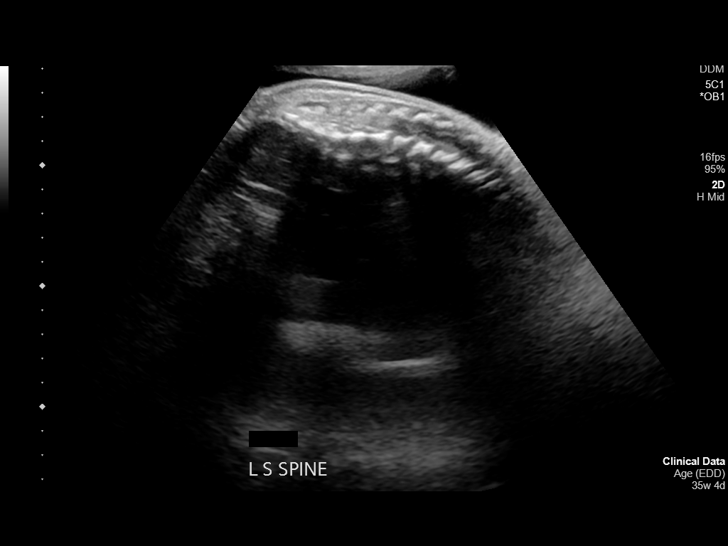
[im 16/83]
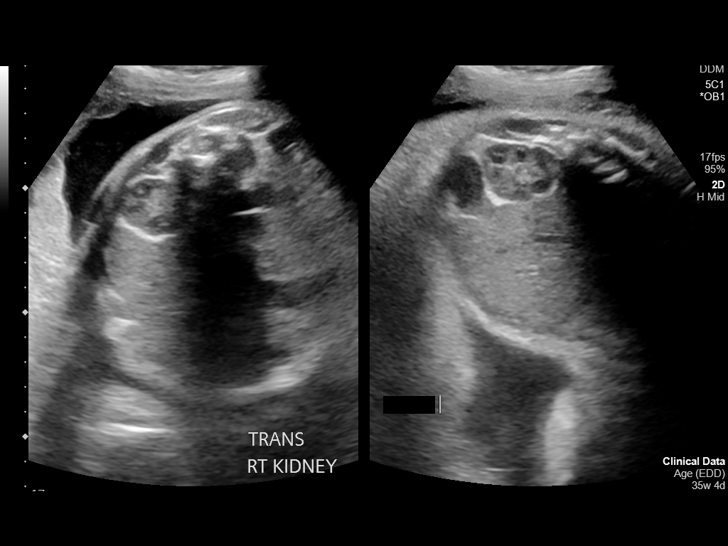
[im 25/83]
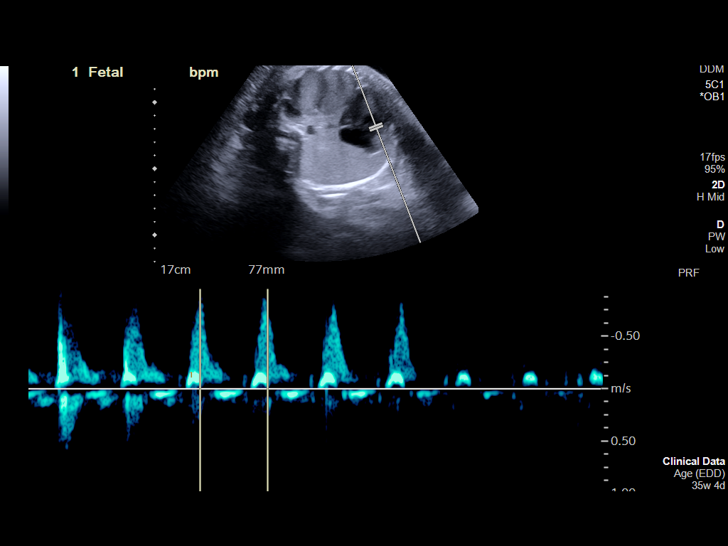
[im 31/83]
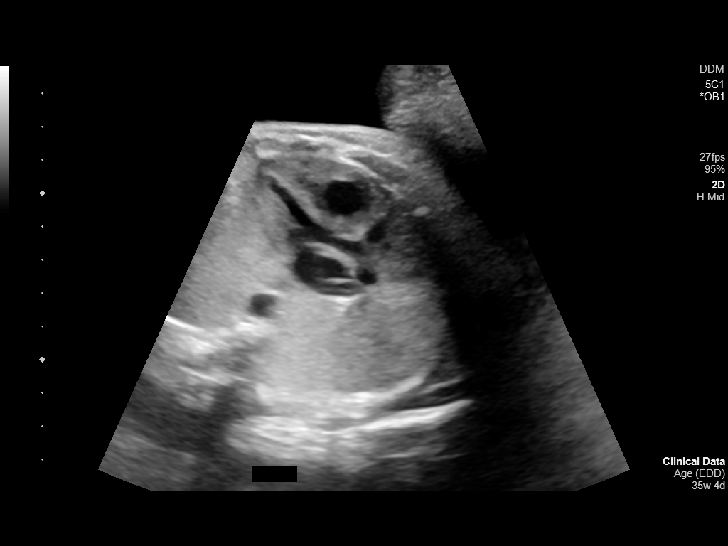
[im 37/83]
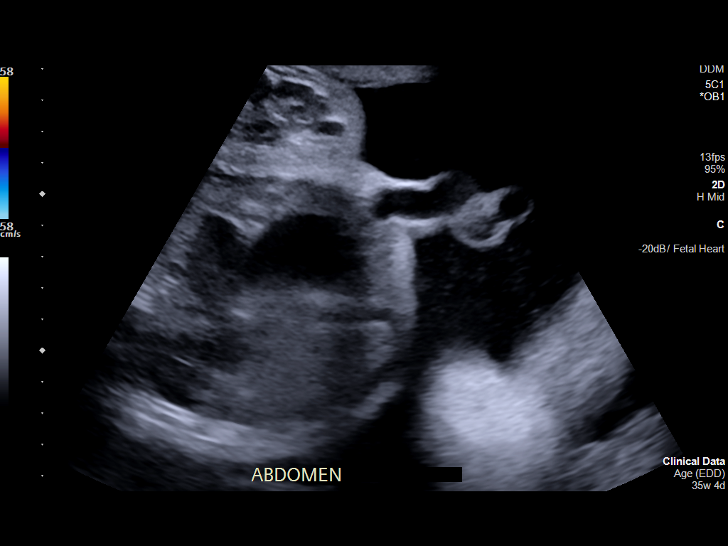
[im 46/83]
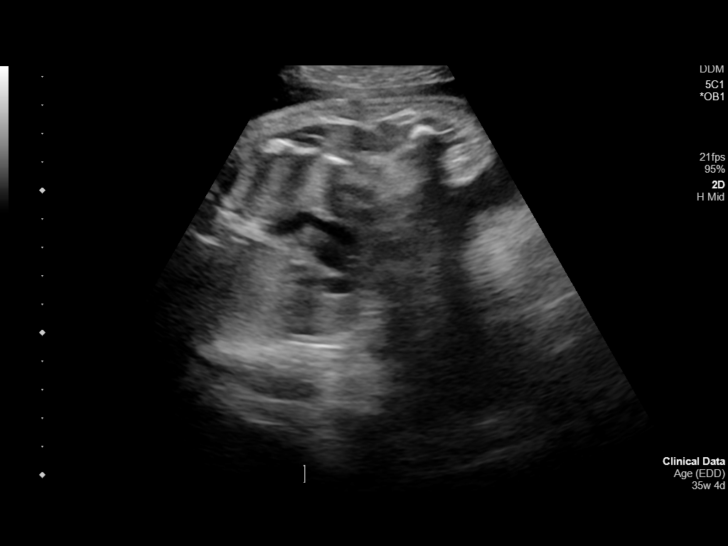
[im 52/83]
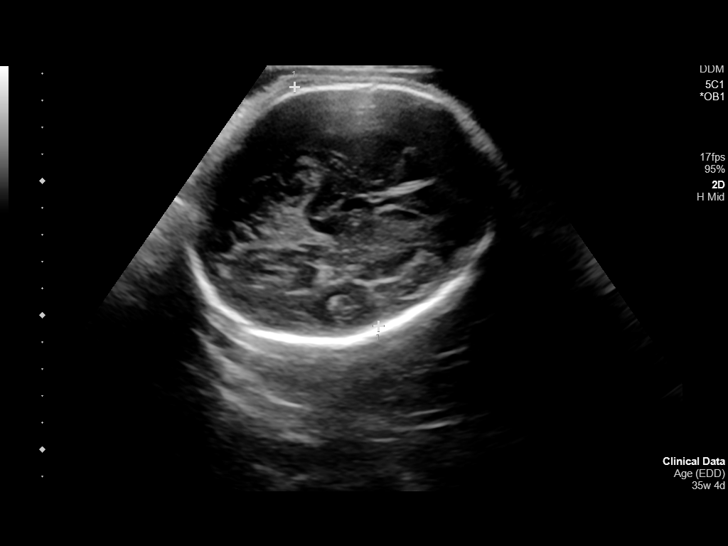
[im 58/83]
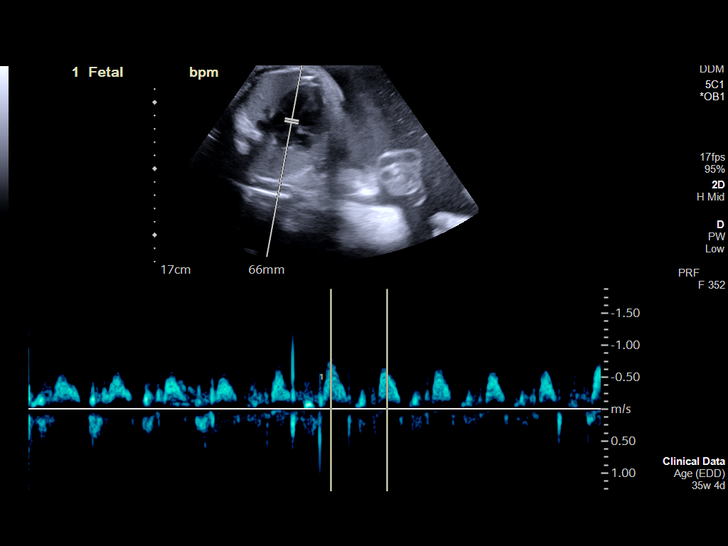
[im 67/83]
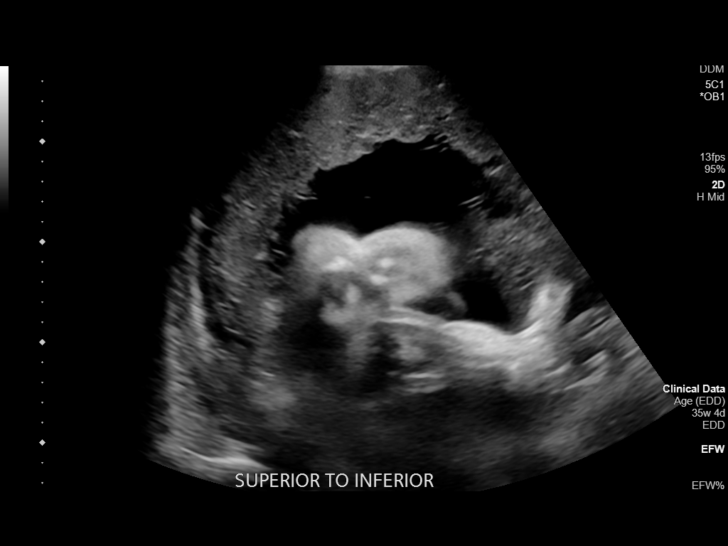
[im 73/83]
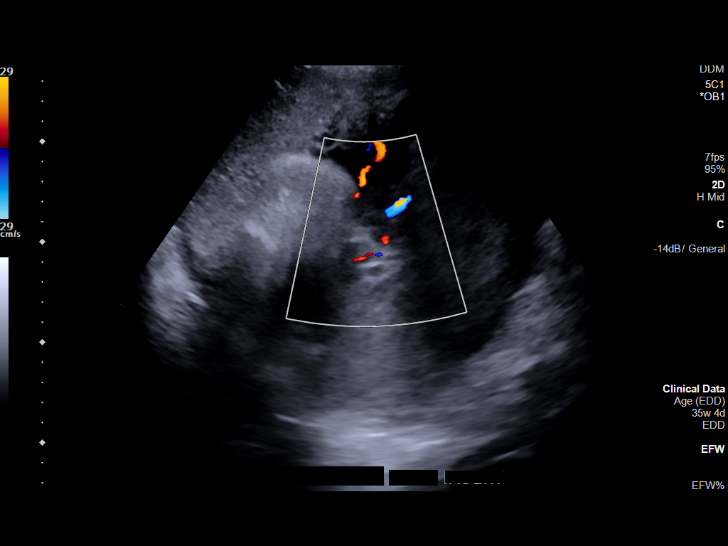
[im 79/83]
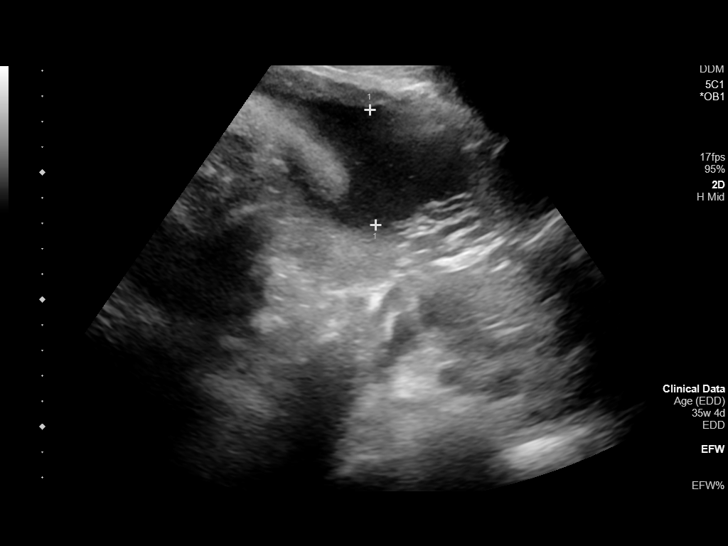

[12 of 28 positions shown; findings below may reference images not displayed]

IMPRESSION: Ms. Fanng presents for MFM consultation and detailed
ultrasound secondary to a history of late term fetal demise
and current pregnancy complicated by polyhydramnios and
macrosomia.

Ultrasound demonstrates a single live pregnancy at 35 [DATE]
weeks.  Dating is by LMP consistent with earliest available
ultrasound performed at [facility] on 05/26/17 with
measurements of 8 [DATE] weeks.    Views of the extremities are
limited by late gestational age.  The remainder of the
visualized fetal anatomy appears normal.  The stomach and
bowel loops have a normal appearance.  The AFI is 25.4 with
an MVP of 7.6, consistent with mild polyhydramnios.  The
estimated fetal weight is greater than the 97% with the AC
measuring four weeks ahead.

The findings were discussed.  See separate MFM
consultation.

## 2019-02-14 NOTE — Telephone Encounter (Signed)
Needs NOB next week

## 2019-02-17 DIAGNOSIS — K047 Periapical abscess without sinus: Secondary | ICD-10-CM | POA: Diagnosis not present

## 2019-02-17 DIAGNOSIS — R220 Localized swelling, mass and lump, head: Secondary | ICD-10-CM | POA: Diagnosis not present

## 2019-02-21 ENCOUNTER — Other Ambulatory Visit: Payer: Self-pay

## 2019-02-21 ENCOUNTER — Ambulatory Visit (INDEPENDENT_AMBULATORY_CARE_PROVIDER_SITE_OTHER): Payer: Self-pay | Admitting: Obstetrics and Gynecology

## 2019-02-21 ENCOUNTER — Encounter: Payer: Self-pay | Admitting: Obstetrics and Gynecology

## 2019-02-21 ENCOUNTER — Other Ambulatory Visit (HOSPITAL_COMMUNITY)
Admission: RE | Admit: 2019-02-21 | Discharge: 2019-02-21 | Disposition: A | Discharge status: Home / Self Care | Payer: BC Managed Care – PPO | Source: Ambulatory Visit | Attending: Obstetrics and Gynecology | Admitting: Obstetrics and Gynecology

## 2019-02-21 VITALS — BP 148/89 | Wt 162.0 lb

## 2019-02-21 DIAGNOSIS — Z113 Encounter for screening for infections with a predominantly sexual mode of transmission: Secondary | ICD-10-CM | POA: Insufficient documentation

## 2019-02-21 DIAGNOSIS — Z124 Encounter for screening for malignant neoplasm of cervix: Secondary | ICD-10-CM

## 2019-02-21 DIAGNOSIS — Z3480 Encounter for supervision of other normal pregnancy, unspecified trimester: Secondary | ICD-10-CM

## 2019-02-21 DIAGNOSIS — N912 Amenorrhea, unspecified: Secondary | ICD-10-CM

## 2019-02-21 DIAGNOSIS — O200 Threatened abortion: Secondary | ICD-10-CM | POA: Diagnosis not present

## 2019-02-21 DIAGNOSIS — Z3689 Encounter for other specified antenatal screening: Secondary | ICD-10-CM

## 2019-02-21 DIAGNOSIS — Z3A01 Less than 8 weeks gestation of pregnancy: Secondary | ICD-10-CM | POA: Diagnosis not present

## 2019-02-21 DIAGNOSIS — O3680X0 Pregnancy with inconclusive fetal viability, not applicable or unspecified: Secondary | ICD-10-CM

## 2019-02-21 DIAGNOSIS — Z3201 Encounter for pregnancy test, result positive: Secondary | ICD-10-CM

## 2019-02-21 LAB — POCT URINE PREGNANCY: Preg Test, Ur: POSITIVE — AB

## 2019-02-21 NOTE — Progress Notes (Signed)
New Obstetric Patient H&P    Chief Complaint: "Desires prenatal care"   History of Present Illness: Patient is a 28 y.o. [id number] Not Hispanic or Latino female, presents with amenorrhea and positive home pregnancy test. Patient's last menstrual period was 12/29/2018 (exact date). and based on her  LMP, her EDD is Estimated Date of Delivery: 10/05/19  and her EGA is [id number]. Cycles are regular.   She had a urine pregnancy test which was positive 2 week(s)  ago. Her last menstrual period was normal. Since her LMP she claims she has experienced some food aversions, fatigue. She does report vaginal bleeding over the past week. Her past medical history is noncontributory. Her prior pregnancies are notable for still birth with G1, health delivery G2.  IUFD was at 36 weeks normal anatomy, normal hypercoaguable work up and TORCH titers, normal fetal microarray.  G2 with accelerated fetal growth and polyhydramnios with 37 week IOL.    Since her LMP, she admits to the use of tobacco products  no There are cats in the home in the home  no She admits close contact with children on a regular basis  yes  She has had chicken pox in the past yes She has had Tuberculosis exposures, symptoms, or previously tested positive for TB   no Current or past history of domestic violence. no  Genetic Screening/Teratology Counseling: (Includes patient, baby's father, or anyone in either family with:)   1. [facility]/= 7035 [facility]  no 2. Thalassemia ([place], [place], Mediterranean, or Asian background): MCV<80  no 3. Neural tube defect (meningomyelocele, spina bifida, anencephaly)  no 4. Congenital heart defect  no  5. Down syndrome  no 6. Tay-Sachs (Jewish, [place])  no 7. Canavan's Disease  no 8. Sickle cell disease or trait (African)  no  9. Hemophilia or other blood disorders  no  10. Muscular dystrophy  no  11. Cystic fibrosis  no  12. Huntington's Chorea  no  13. Mental retardation/autism  no 14.  Other inherited genetic or chromosomal disorder  no 15. Maternal metabolic disorder (DM, PKU, etc)  no 16. Patient or FOB with a child with a birth defect not listed above no  16a. Patient or FOB with a birth defect themselves no 17. Recurrent pregnancy loss, or stillbirth  Yes prior stillbirth 1318. Any medications since LMP other than prenatal vitamins (include vitamins, supplements, OTC meds, drugs, alcohol)  no 19. Any other genetic/environmental exposure to discuss  no  Infection History:   1. Lives with someone with TB or TB exposed  no  2. Patient or partner has history of genital herpes  no 3. Rash or viral illness since LMP  no 4. History of STI (GC, CT, HPV, syphilis, HIV)  no 5. History of recent travel :  no  Other pertinent information:  no     Review of Systems:10 point review of systems negative unless otherwise noted in HPI  Past Medical History:  Past Medical History:  Diagnosis Date  . History of intrauterine fetal death, currently pregnant     Past Surgical History:  Past Surgical History:  Procedure Laterality Date  . TONSILLECTOMY      Gynecologic History: Patient's last menstrual period was 12/29/2018 (exact date).  Obstetric History: [id number]  Family History:  No family history on file.  Social History:  Social History   Socioeconomic History  . Marital status: Single    Spouse name: Not on file  . Number of children: Not  on file  . Years of education: Not on file  . Highest education level: Not on file  Occupational History  . Not on file  Social Needs  . Financial resource strain: Not on file  . Food insecurity    Worry: Not on file    Inability: Not on file  . Transportation needs    Medical: Not on file    Non-medical: Not on file  Tobacco Use  . Smoking status: Never Smoker  . Smokeless tobacco: Never Used  Substance and Sexual Activity  . Alcohol use: No  . Drug use: No  . Sexual activity: Yes  Lifestyle  . Physical  activity    Days per week: Not on file    Minutes per session: Not on file  . Stress: Not on file  Relationships  . Social Musician on phone: Not on file    Gets together: Not on file    Attends religious service: Not on file    Active member of club or organization: Not on file    Attends meetings of clubs or organizations: Not on file    Relationship status: Not on file  . Intimate partner violence    Fear of current or ex partner: Not on file    Emotionally abused: Not on file    Physically abused: Not on file    Forced sexual activity: Not on file  Other Topics Concern  . Not on file  Social History Narrative  . Not on file    Allergies:  No Known Allergies  Medications: Prior to Admission medications   Medication Sig Start Date End Date Taking? Authorizing Provider  acetaminophen (TYLENOL) 325 MG tablet Take 2 tablets (650 mg total) by mouth every 4 (four) hours as needed (for pain scale < 4). 12/18/17   Schuman, Christanna R, MD  amLODipine (NORVASC) 10 MG tablet TAKE 1 TABLET BY MOUTH EVERY DAY 02/14/18   Schuman, Christanna R, MD  ferrous sulfate 325 (65 FE) MG EC tablet Take 325 mg by mouth 3 (three) times daily with meals.    [provider]  ibuprofen (ADVIL,MOTRIN) 600 MG tablet Take 1 tablet (600 mg total) by mouth every 6 (six) hours. 12/19/17   Schuman, Jaquelyn Bitter, MD  Prenatal Vit-Fe Fumarate-FA (MULTIVITAMIN-PRENATAL) 27-0.8 MG TABS tablet Take 1 tablet by mouth daily at 12 noon.    [provider]    Physical Exam Vitals: Blood pressure (!) 148/89, weight 162 lb (73.5 kg), last menstrual period 12/29/2018, not currently breastfeeding.  General: NAD HEENT: normocephalic, anicteric Thyroid: no enlargement, no palpable nodules Pulmonary: No increased work of breathing, CTAB Cardiovascular: RRR, distal pulses 2+ Abdomen: NABS, soft, non-tender, non-distended.  Umbilicus without lesions.  No hepatomegaly, splenomegaly or masses  palpable. No evidence of hernia  Genitourinary:  External: Normal external female genitalia.  Normal urethral meatus, normal  Bartholin's and Skene's glands.    Vagina: Normal vaginal mucosa, no evidence of prolapse.    Cervix: Grossly normal in appearance, 1 scopet of dark red blood in posterior vault, cervix closed  Uterus:  Non-enlarged, mobile, normal contour.  No CMT  Adnexa: ovaries non-enlarged, no adnexal masses  Rectal: deferred Extremities: no edema, erythema, or tenderness Neurologic: Grossly intact Psychiatric: mood appropriate, affect full  Bedside ultrasound - possible small fundal gestational sac, decidual reaction noted.  Adnexa image normal no evidence of masses or corpus luteum.   Assessment: 28 y.o. G3P1101 at Unknown presenting to initiate prenatal care  Plan: 1) Avoid alcoholic beverages. 2) Patient encouraged not to smoke.  3) Discontinue the use of all non-medicinal drugs and chemicals.  4) Take prenatal vitamins daily.  5) Nutrition, food safety (fish, cheese advisories, and high nitrite foods) and exercise discussed. 6) Hospital and practice style discussed with cross coverage system.  7) Genetic Screening, such as with 1st Trimester Screening, cell free fetal DNA, AFP testing, and Ultrasound, as well as with amniocentesis and CVS as appropriate, is discussed with patient. At the conclusion of today's visit patient requested genetic testing 8) BP mildly elevated today - monitor.  Given still birth history potential component of white coat hypertension today. 9) Vaginal bleeding - Rh positive rhogam not indicated.  Beside ultrasound showing possible early gestational sac.  Will trend HCG.  Follow up ultrasound next week.  Adnexa imaged normally on besides ultrasound 10) Return in about 1 week (around 02/28/2019) for follow up labs on 02/23/2019, ROB and dating ultrasound in 1 week.    Malachy Mood, MD, [facility], [facility] 02/21/2019,  2:39 PM

## 2019-02-21 NOTE — Progress Notes (Signed)
NOB Bleeding since 8/21

## 2019-02-23 LAB — RPR+RH+ABO+RUB AB+AB SCR+CB...
Antibody Screen: NEGATIVE
HIV Screen 4th Generation wRfx: NONREACTIVE
Hematocrit: 37 % (ref 34.0–46.6)
Hemoglobin: 12.1 g/dL (ref 11.1–15.9)
Hepatitis B Surface Ag: NEGATIVE
MCH: 28.1 pg (ref 26.6–33.0)
MCHC: 32.7 g/dL (ref 31.5–35.7)
MCV: 86 fL (ref 79–97)
Platelets: 298 10*3/uL (ref 150–450)
RBC: 4.31 x10E6/uL (ref 3.77–5.28)
RDW: 12.4 % (ref 11.7–15.4)
RPR Ser Ql: NONREACTIVE
Rh Factor: POSITIVE
Rubella Antibodies, IGG: 1.17 index (ref >0.99)
Varicella zoster IgG: 960 index (ref >165)
WBC: 4.9 10*3/uL (ref 3.4–10.8)

## 2019-02-23 LAB — URINE CULTURE

## 2019-02-23 LAB — HEMOGLOBINOPATHY EVALUATION
HGB C: 0 %
HGB S: 0 %
HGB VARIANT: 0 %
Hemoglobin A2 Quantitation: 2.4 % (ref 1.8–3.2)
Hemoglobin F Quantitation: 0 % (ref 0.0–2.0)
Hgb A: 97.6 % (ref 96.4–98.8)

## 2019-02-23 LAB — PROGESTERONE: Progesterone: 1.4 ng/mL

## 2019-02-23 LAB — BETA HCG QUANT (REF LAB): hCG Quant: 204 m[IU]/mL

## 2019-02-27 LAB — CYTOLOGY - PAP
Chlamydia: NEGATIVE
Diagnosis: NEGATIVE
Neisseria Gonorrhea: NEGATIVE

## 2019-03-06 ENCOUNTER — Other Ambulatory Visit: Payer: Self-pay | Admitting: Obstetrics and Gynecology

## 2019-03-06 ENCOUNTER — Ambulatory Visit (INDEPENDENT_AMBULATORY_CARE_PROVIDER_SITE_OTHER): Payer: BC Managed Care – PPO | Admitting: Obstetrics and Gynecology

## 2019-03-06 ENCOUNTER — Ambulatory Visit (INDEPENDENT_AMBULATORY_CARE_PROVIDER_SITE_OTHER): Payer: BC Managed Care – PPO

## 2019-03-06 ENCOUNTER — Other Ambulatory Visit: Payer: Self-pay

## 2019-03-06 VITALS — BP 146/96 | Wt 158.0 lb

## 2019-03-06 DIAGNOSIS — Z3689 Encounter for other specified antenatal screening: Secondary | ICD-10-CM

## 2019-03-06 DIAGNOSIS — N8311 Corpus luteum cyst of right ovary: Secondary | ICD-10-CM | POA: Diagnosis not present

## 2019-03-06 DIAGNOSIS — Z3480 Encounter for supervision of other normal pregnancy, unspecified trimester: Secondary | ICD-10-CM

## 2019-03-06 DIAGNOSIS — O3481 Maternal care for other abnormalities of pelvic organs, first trimester: Secondary | ICD-10-CM

## 2019-03-06 DIAGNOSIS — O039 Complete or unspecified spontaneous abortion without complication: Secondary | ICD-10-CM | POA: Diagnosis not present

## 2019-03-06 NOTE — Progress Notes (Signed)
Gynecology Ultrasound Follow Up   Chief Complaint  Patient presents with  . Follow up ultrsaound, suspected miscarriage     History of Present Illness: Patient is a 28 y.o. female who presents today for ultrasound evaluation of the above .  Ultrasound demonstrates the following findings Adnexa: no masses seen  Uterus: no gestational sac  Additional: otherwise normal pelvic ultrasound  Patient reports bleeding off and on over the past few weeks. She had a negative home pregnancy test 1 week ago.  She has no bleeding today.   Past Medical History:  Diagnosis Date  . History of intrauterine fetal death, currently pregnant     Past Surgical History:  Procedure Laterality Date  . TONSILLECTOMY       No family history on file.  Social History   Socioeconomic History  . Marital status: Single    Spouse name: Not on file  . Number of children: Not on file  . Years of education: Not on file  . Highest education level: Not on file  Occupational History  . Not on file  Social Needs  . Financial resource strain: Not on file  . Food insecurity    Worry: Not on file    Inability: Not on file  . Transportation needs    Medical: Not on file    Non-medical: Not on file  Tobacco Use  . Smoking status: Never Smoker  . Smokeless tobacco: Never Used  Substance and Sexual Activity  . Alcohol use: No  . Drug use: No  . Sexual activity: Yes  Lifestyle  . Physical activity    Days per week: Not on file    Minutes per session: Not on file  . Stress: Not on file  Relationships  . Social Musicianconnections    Talks on phone: Not on file    Gets together: Not on file    Attends religious service: Not on file    Active member of club or organization: Not on file    Attends meetings of clubs or organizations: Not on file    Relationship status: Not on file  . Intimate partner violence    Fear of current or ex partner: Not on file    Emotionally abused: Not on file    Physically  abused: Not on file    Forced sexual activity: Not on file  Other Topics Concern  . Not on file  Social History Narrative  . Not on file    No Known Allergies  Prior to Admission medications   Medication Sig Start Date End Date Taking? Authorizing Provider  acetaminophen (TYLENOL) 325 MG tablet Take 2 tablets (650 mg total) by mouth every 4 (four) hours as needed (for pain scale < 4). 12/18/17   Schuman, Christanna R, MD  amLODipine (NORVASC) 10 MG tablet TAKE 1 TABLET BY MOUTH EVERY DAY Patient not taking: Reported on 02/21/2019 02/14/18   Natale MilchSchuman, Christanna R, MD  ferrous sulfate 325 (65 FE) MG EC tablet Take 325 mg by mouth 3 (three) times daily with meals.    [provider]  ibuprofen (ADVIL,MOTRIN) 600 MG tablet Take 1 tablet (600 mg total) by mouth every 6 (six) hours. Patient not taking: Reported on 02/21/2019 12/19/17   Natale MilchSchuman, Christanna R, MD  Prenatal Vit-Fe Fumarate-FA (MULTIVITAMIN-PRENATAL) 27-0.8 MG TABS tablet Take 1 tablet by mouth daily at 12 noon.    [provider]    Physical Exam BP (!) 146/96   Wt 158 lb (71.7 kg)  LMP 12/29/2018 (Exact Date)   BMI 26.29 kg/m    General: NAD HEENT: normocephalic, anicteric Pulmonary: No increased work of breathing Extremities: no edema, erythema, or tenderness Neurologic: Grossly intact, normal gait Psychiatric: mood appropriate, affect full  Imaging Results US Ob Transvaginal  Result Date: 03/06/2019 Patient Name: Sade Hollon DOB: 1990-09-28 MRN: [id number] ULTRASOUND REPORT Location: [facility] Date of Service: 03/06/2019 Indications: dating, possible miscarriage Findings: No intrauterine pregnancy seen. No obvious retained products visualized. Endometrium: 13.61mm Right Ovary is normal in appearance. Left Ovary is normal appearance. Corpus luteal cyst:  Right ovary Survey of the adnexa demonstrates no adnexal masses. There is no free peritoneal fluid in the cul de sac. Impression: 1. No intrauterine  pregnancy seen. Vita Barley, RT The ultrasound images and findings were reviewed by me and I agree with the above report. Prentice Docker, MD, [facility], [facility] 03/06/2019 2:02 PM       Assessment: 28 y.o. G3P1101  1. Complete abortion      Plan: Problem List Items Addressed This Visit    None    Visit Diagnoses    Complete abortion    -  Primary     Discussed ultrasound findings today.  No questions. She wishes to consider contraceptive options.  Prentice Docker, MD, [facility], [facility] 03/06/2019 2:10 PM

## 2020-01-07 DIAGNOSIS — T148XXA Other injury of unspecified body region, initial encounter: Secondary | ICD-10-CM | POA: Diagnosis not present

## 2020-01-07 DIAGNOSIS — M795 Residual foreign body in soft tissue: Secondary | ICD-10-CM | POA: Diagnosis not present

## 2020-01-07 DIAGNOSIS — W25XXXA Contact with sharp glass, initial encounter: Secondary | ICD-10-CM | POA: Diagnosis not present

## 2020-01-23 DIAGNOSIS — Z20822 Contact with and (suspected) exposure to covid-19: Secondary | ICD-10-CM | POA: Diagnosis not present

## 2020-02-15 DIAGNOSIS — Z20822 Contact with and (suspected) exposure to covid-19: Secondary | ICD-10-CM | POA: Diagnosis not present

## 2020-11-28 ENCOUNTER — Emergency Department: Payer: BC Managed Care – PPO

## 2020-11-28 ENCOUNTER — Other Ambulatory Visit: Payer: Self-pay

## 2020-11-28 ENCOUNTER — Emergency Department
Admission: EM | Admit: 2020-11-28 | Discharge: 2020-11-28 | Disposition: A | Discharge status: Home / Self Care | Payer: BC Managed Care – PPO | Attending: Emergency Medicine | Admitting: Emergency Medicine

## 2020-11-28 DIAGNOSIS — O039 Complete or unspecified spontaneous abortion without complication: Secondary | ICD-10-CM | POA: Insufficient documentation

## 2020-11-28 DIAGNOSIS — Z3A00 Weeks of gestation of pregnancy not specified: Secondary | ICD-10-CM | POA: Insufficient documentation

## 2020-11-28 LAB — COMPREHENSIVE METABOLIC PANEL
ALT: 20 U/L (ref 0–44)
AST: 25 U/L (ref 15–41)
Albumin: 3.9 g/dL (ref 3.5–5.0)
Alkaline Phosphatase: 56 U/L (ref 38–126)
Anion gap: 11 (ref 5–15)
BUN: 6 mg/dL (ref 6–20)
CO2: 20 mmol/L — ABNORMAL LOW (ref 22–32)
Calcium: 9 mg/dL (ref 8.9–10.3)
Chloride: 105 mmol/L (ref 98–111)
Creatinine, Ser: 0.55 mg/dL (ref 0.44–1.00)
GFR, Estimated: >60 mL/min (ref >60)
Glucose, Bld: 116 mg/dL — ABNORMAL HIGH (ref 70–99)
Potassium: 3 mmol/L — ABNORMAL LOW (ref 3.5–5.1)
Sodium: 136 mmol/L (ref 135–145)
Total Bilirubin: 0.5 mg/dL (ref 0.3–1.2)
Total Protein: 7.3 g/dL (ref 6.5–8.1)

## 2020-11-28 LAB — CBC
HCT: 36.5 % (ref 36.0–46.0)
Hemoglobin: 12.4 g/dL (ref 12.0–15.0)
MCH: 29 pg (ref 26.0–34.0)
MCHC: 34 g/dL (ref 30.0–36.0)
MCV: 85.3 fL (ref 80.0–100.0)
Platelets: 249 10*3/uL (ref 150–400)
RBC: 4.28 MIL/uL (ref 3.87–5.11)
RDW: 12.6 % (ref 11.5–15.5)
WBC: 10 10*3/uL (ref 4.0–10.5)
nRBC: 0.2 % (ref 0.0–0.2)

## 2020-11-28 LAB — HCG, QUANTITATIVE, PREGNANCY: hCG, Beta Chain, Quant, S: 739 m[IU]/mL — ABNORMAL HIGH (ref <5)

## 2020-11-28 NOTE — ED Notes (Signed)
Taken for u/s via w/c

## 2020-11-28 NOTE — ED Provider Notes (Signed)
[facility] Emergency Department Provider Note  Time seen: 3:11 PM  I have reviewed the triage vital signs and the nursing notes.   HISTORY  Chief Complaint Miscarriage   HPI Cathy [name] is a 30 y.o. female G3 P1 A1 presents to the emergency department for vaginal bleeding and miscarriage.  According to the patient she is pregnant is not entirely sure how far along she was.  Patient began experiencing some vaginal spotting followed by heavier bleeding today and passed what appeared to be a fetus.  Patient brought it in a Ziploc bag with her.  In the bag there is a fetus present approximately 2 to 3 inches in length.  Patient states mild lower abdominal cramping but much improved from earlier.   Past Medical History:  Diagnosis Date   History of intrauterine fetal death, currently pregnant     There are no problems to display for this patient.   Past Surgical History:  Procedure Laterality Date   TONSILLECTOMY      Prior to Admission medications   Medication Sig Start Date End Date Taking? Authorizing Provider  acetaminophen (TYLENOL) 325 MG tablet Take 2 tablets (650 mg total) by mouth every 4 (four) hours as needed (for pain scale < 4). 12/18/17   Schuman, Christanna R, MD  amLODipine (NORVASC) 10 MG tablet TAKE 1 TABLET BY MOUTH EVERY DAY Patient not taking: Reported on 02/21/2019 02/14/18   Natale Milch, MD  ferrous sulfate 325 (65 FE) MG EC tablet Take 325 mg by mouth 3 (three) times daily with meals.    [provider]  ibuprofen (ADVIL,MOTRIN) 600 MG tablet Take 1 tablet (600 mg total) by mouth every 6 (six) hours. Patient not taking: Reported on 02/21/2019 12/19/17   Natale Milch, MD  Prenatal Vit-Fe Fumarate-FA (MULTIVITAMIN-PRENATAL) 27-0.8 MG TABS tablet Take 1 tablet by mouth daily at 12 noon.    [provider]    No Known Allergies  History reviewed. No pertinent family history.  Social History Social  History   Tobacco Use   Smoking status: Never   Smokeless tobacco: Never  Substance Use Topics   Alcohol use: No   Drug use: No    Review of Systems Constitutional: Negative for fever. Cardiovascular: Negative for chest pain. Respiratory: Negative for shortness of breath. Gastrointestinal: Lower abdominal cramping, mild currently. Genitourinary: Negative for urinary compaints.  Positive for vaginal bleeding. Musculoskeletal: Negative for musculoskeletal complaints Neurological: Negative for headache All other ROS negative  ____________________________________________   PHYSICAL EXAM:  VITAL SIGNS: ED Triage Vitals  Enc Vitals Group     BP 11/28/20 1328 (!) 145/91     Pulse Rate 11/28/20 1328 (!) 135     Resp 11/28/20 1328 16     Temp 11/28/20 1328 98.6 F (37 C)     Temp Source 11/28/20 1328 Oral     SpO2 11/28/20 1328 100 %     Weight 11/28/20 1325 135 lb (61.2 kg)     Height 11/28/20 1325 5\' 5"  (1.651 m)     Head Circumference --      Peak Flow --      Pain Score 11/28/20 1324 0     Pain Loc --      Pain Edu? --      Excl. in GC? --    Constitutional: Alert and oriented. Well appearing and in no distress. Eyes: Normal exam ENT      Head: Normocephalic and atraumatic.  Mouth/Throat: Mucous membranes are moist. Cardiovascular: Normal rate, regular rhythm.  Respiratory: Normal respiratory effort without tachypnea nor retractions. Breath sounds are clear Gastrointestinal: Soft and nontender. No distention.   Musculoskeletal: Nontender with normal range of motion in all extremities.  Neurologic:  Normal speech and language. No gross focal neurologic deficits  Skin:  Skin is warm, dry and intact.  Psychiatric: Mood and affect are normal.   ____________________________________________    RADIOLOGY  Ultrasound shows no gestational sac.  Prominence in the endometrial canal suspicious for possible retained  products.  ____________________________________________   INITIAL IMPRESSION / ASSESSMENT AND PLAN / ED COURSE  Pertinent labs & imaging results that were available during my care of the patient were reviewed by me and considered in my medical decision making (see chart for details).   Patient presents emergency department following a miscarriage.  Patient was unsure how far along she was.  Has a history of a stillborn previously and has 1 living child.  Patient brought what appears to be an intact fetus approximately 2 to 3 inches in length.  We will check labs including quantitative beta-hCG.  We will obtain an ultrasound to rule out retained products.  Reviewed the patient's chart she is a positive blood type, RhoGAM is not needed.  Patient's ultrasound has resulted showing no gestational sac with possible small amount of retained products.  I spoke to Dr. Jean Rosenthal regarding this.  He states the option of dosing Cytotec versus follow-up next week.  Patient has an appointment on the 15th with Dr. Bonney Aid.  She would prefer to hold off on Cytotec.  States after she got back from ultrasound she passed several more clots with what appeared to be tissue as well.  I discussed my typical miscarriage return precautions including bleeding precautions.  Patient agreeable to plan of care.  Malie Lewey was evaluated in Emergency Department on 11/28/2020 for the symptoms described in the history of present illness. She was evaluated in the context of the global COVID-19 pandemic, which necessitated consideration that the patient might be at risk for infection with the SARS-CoV-2 virus that causes COVID-19. Institutional protocols and algorithms that pertain to the evaluation of patients at risk for COVID-19 are in a state of rapid change based on information released by regulatory bodies including the CDC and federal and state organizations. These policies and algorithms were followed during the patient's care in  the ED.  ____________________________________________   FINAL CLINICAL IMPRESSION(S) / ED DIAGNOSES  Miscarriage   Minna Antis, MD 11/28/20 1911

## 2020-11-28 NOTE — ED Triage Notes (Signed)
Pt was on her way home from work and started bleeding a lot. She advised she passed some large clots. She thinks she has had a miscarriage and has what she thinks is the fetus in a bag with her. Pt is having some mild abdominal cramping as well. Pt in NAD.

## 2020-11-28 NOTE — ED Notes (Signed)
See triage note  States had some heavy bleeding PTA  States she passed a large clot unsure if there was any tissue  No pain at present

## 2020-12-03 ENCOUNTER — Ambulatory Visit (INDEPENDENT_AMBULATORY_CARE_PROVIDER_SITE_OTHER): Payer: BC Managed Care – PPO | Admitting: Obstetrics and Gynecology

## 2020-12-03 ENCOUNTER — Other Ambulatory Visit: Payer: Self-pay

## 2020-12-03 DIAGNOSIS — O039 Complete or unspecified spontaneous abortion without complication: Secondary | ICD-10-CM | POA: Diagnosis not present

## 2020-12-03 DIAGNOSIS — D62 Acute posthemorrhagic anemia: Secondary | ICD-10-CM | POA: Diagnosis not present

## 2020-12-04 LAB — BETA HCG QUANT (REF LAB): hCG Quant: 197 m[IU]/mL

## 2020-12-04 LAB — CBC
Hematocrit: 32.9 % — ABNORMAL LOW (ref 34.0–46.6)
Hemoglobin: 11.2 g/dL (ref 11.1–15.9)
MCH: 28.8 pg (ref 26.6–33.0)
MCHC: 34 g/dL (ref 31.5–35.7)
MCV: 85 fL (ref 79–97)
Platelets: 250 10*3/uL (ref 150–450)
RBC: 3.89 x10E6/uL (ref 3.77–5.28)
RDW: 12.9 % (ref 11.7–15.4)
WBC: 7 10*3/uL (ref 3.4–10.8)

## 2020-12-11 NOTE — Progress Notes (Signed)
Obstetric Problem Visit    Chief Complaint: No chief complaint on file.   History of Present Illness: Patient is a 30 y.o. [id number] Unknown presenting for first trimester bleeding.  The onset of bleeding was 1 week ago.  Is bleeding equal to or greater than normal menstrual flow:  Yes Any recent trauma:  No Recent intercourse:  No Prior ultrasound demonstrating IUP: none.  Prior ultrasound demonstrating viable IUP:  No Prior Serum HCG:  Yes 739 mIU/mL on 11/28/2020 Rh status: O   Patient does report passing fetus in ER.  Bleeding has since slowed down.  Patient was approximately 10-[redacted] weeks gestation  Review of Systems: Review of Systems  Constitutional: Negative.   Gastrointestinal: Negative.   Genitourinary: Negative.   Psychiatric/Behavioral: Negative.     Past Medical History:  There are no problems to display for this patient.   Past Surgical History:  Past Surgical History:  Procedure Laterality Date   TONSILLECTOMY      Obstetric History: G3P1101  Family History:  No family history on file.  Social History:  Social History   Socioeconomic History   Marital status: Single    Spouse name: Not on file   Number of children: Not on file   Years of education: Not on file   Highest education level: Not on file  Occupational History   Not on file  Tobacco Use   Smoking status: Never   Smokeless tobacco: Never  Substance and Sexual Activity   Alcohol use: No   Drug use: No   Sexual activity: Yes  Other Topics Concern   Not on file  Social History Narrative   Not on file   Social Determinants of Health   Financial Resource Strain: Not on file  Food Insecurity: Not on file  Transportation Needs: Not on file  Physical Activity: Not on file  Stress: Not on file  Social Connections: Not on file  Intimate Partner Violence: Not on file    Allergies:  No Known Allergies  Medications: Prior to Admission medications   Medication Sig Start Date End  Date Taking? Authorizing Provider  acetaminophen (TYLENOL) 325 MG tablet Take 2 tablets (650 mg total) by mouth every 4 (four) hours as needed (for pain scale < 4). 12/18/17   Schuman, Christanna R, MD  amLODipine (NORVASC) 10 MG tablet TAKE 1 TABLET BY MOUTH EVERY DAY Patient not taking: Reported on 02/21/2019 02/14/18   Natale Milch, MD  ferrous sulfate 325 (65 FE) MG EC tablet Take 325 mg by mouth 3 (three) times daily with meals. Patient not taking: Reported on 12/03/2020    [provider]  ibuprofen (ADVIL,MOTRIN) 600 MG tablet Take 1 tablet (600 mg total) by mouth every 6 (six) hours. Patient not taking: Reported on 02/21/2019 12/19/17   Natale Milch, MD  Prenatal Vit-Fe Fumarate-FA (MULTIVITAMIN-PRENATAL) 27-0.8 MG TABS tablet Take 1 tablet by mouth daily at 12 noon. Patient not taking: Reported on 12/03/2020    [provider]    Physical Exam Vitals: unknown if currently breastfeeding. General: NAD HEENT: normocephalic, anicteric Pulmonary: No increased work of breathing, GU:  External: Normal external female genitalia.  Normal urethral meatus, normal Bartholin's and Skene's glands.    Vagina: Normal vaginal mucosa, no evidence of prolapse.    Cervix: normal. Closed minimal bleeding  Uterus:none enlarged, no tenderness  Adnexa: ovaries non-enlarged, no adnexal masses  Rectal: deferred Extremities: no edema, erythema, or tenderness Neurologic: Grossly intact Psychiatric: mood appropriate, affect full  Assessment: 30 y.o. [id number] Unknown presenting for evaluation of first trimester vaginal bleeding  Plan: Problem List Items Addressed This Visit   None Visit Diagnoses     Complete abortion    -  Primary   Relevant Orders   Beta hCG quant (ref lab) (Completed)   CBC (Completed)   Acute blood loss anemia       Relevant Orders   CBC (Completed)       1) First trimester bleeding - incidence and clinical course of first trimester bleeding is  discussed in detail with the patient today.  Approximately 1/3 of pregnancies ending in live births experienced 1st trimester bleeding.  The amount of bleeding is variable and not necessarily predictive of outcome.  Sources may be cervical or uterine.  Subchorionic hemorrhages are a frequent concurrent findings on ultrasound and are followed expectantly.  These often absorb or regress spontaneously although risk for expansion and further disruption of the utero-placental interface leading to miscarriage is possible.  There is no clearly documented benefit to limiting or modifying activity and sexual intercourse in altering clinic course of 1st trimester bleeding.    2) If no already done will proceed with TVUS evaluation to document viability, and if uncertain viability or absence of a demonstrable IUP (and no previous documentation of IUP) will trend HCG levels.  3) The patient is O positive  rhogam is therefore not indicated to decrease the risk rhesus alloimmunization.    4) Routine bleeding precautions were discussed with the patient prior the conclusion of today's visit.  Return in about 1 year (around 12/03/2021) for annual.     Vena Austria, MD, [facility], [facility]

## 2021-08-03 DIAGNOSIS — Z332 Encounter for elective termination of pregnancy: Secondary | ICD-10-CM | POA: Diagnosis not present

## 2021-08-04 DIAGNOSIS — O039 Complete or unspecified spontaneous abortion without complication: Secondary | ICD-10-CM | POA: Diagnosis not present

## 2021-08-04 DIAGNOSIS — Z332 Encounter for elective termination of pregnancy: Secondary | ICD-10-CM | POA: Diagnosis not present

## 2022-09-23 DIAGNOSIS — Z3A12 12 weeks gestation of pregnancy: Secondary | ICD-10-CM | POA: Insufficient documentation

## 2022-09-23 DIAGNOSIS — O209 Hemorrhage in early pregnancy, unspecified: Secondary | ICD-10-CM | POA: Diagnosis present

## 2022-09-23 DIAGNOSIS — E876 Hypokalemia: Secondary | ICD-10-CM | POA: Insufficient documentation

## 2022-09-23 LAB — CBC WITH DIFFERENTIAL/PLATELET
Abs Immature Granulocytes: 0 10*3/uL (ref 0.00–0.07)
Basophils Absolute: 0 10*3/uL (ref 0.0–0.1)
Basophils Relative: 1 %
Eosinophils Absolute: 0.1 10*3/uL (ref 0.0–0.5)
Eosinophils Relative: 1 %
HCT: 35.8 % — ABNORMAL LOW (ref 36.0–46.0)
Hemoglobin: 11.5 g/dL — ABNORMAL LOW (ref 12.0–15.0)
Immature Granulocytes: 0 %
Lymphocytes Relative: 44 %
Lymphs Abs: 2.7 10*3/uL (ref 0.7–4.0)
MCH: 28 pg (ref 26.0–34.0)
MCHC: 32.1 g/dL (ref 30.0–36.0)
MCV: 87.1 fL (ref 80.0–100.0)
Monocytes Absolute: 0.4 10*3/uL (ref 0.1–1.0)
Monocytes Relative: 7 %
Neutro Abs: 2.9 10*3/uL (ref 1.7–7.7)
Neutrophils Relative %: 47 %
Platelets: 289 10*3/uL (ref 150–400)
RBC: 4.11 MIL/uL (ref 3.87–5.11)
RDW: 13 % (ref 11.5–15.5)
WBC: 6 10*3/uL (ref 4.0–10.5)
nRBC: 0 % (ref 0.0–0.2)

## 2022-09-23 LAB — COMPREHENSIVE METABOLIC PANEL
ALT: 18 U/L (ref 0–44)
AST: 26 U/L (ref 15–41)
Albumin: 3.9 g/dL (ref 3.5–5.0)
Alkaline Phosphatase: 54 U/L (ref 38–126)
Anion gap: 10 (ref 5–15)
BUN: 10 mg/dL (ref 6–20)
CO2: 22 mmol/L (ref 22–32)
Calcium: 9 mg/dL (ref 8.9–10.3)
Chloride: 104 mmol/L (ref 98–111)
Creatinine, Ser: 0.67 mg/dL (ref 0.44–1.00)
GFR, Estimated: >60 mL/min (ref >60)
Glucose, Bld: 146 mg/dL — ABNORMAL HIGH (ref 70–99)
Potassium: 2.7 mmol/L — CL (ref 3.5–5.1)
Sodium: 136 mmol/L (ref 135–145)
Total Bilirubin: 0.5 mg/dL (ref 0.3–1.2)
Total Protein: 6.9 g/dL (ref 6.5–8.1)

## 2022-09-23 LAB — TYPE AND SCREEN
ABO/RH(D): O POS
Antibody Screen: NEGATIVE

## 2022-09-23 LAB — HCG, QUANTITATIVE, PREGNANCY: hCG, Beta Chain, Quant, S: 7 m[IU]/mL — ABNORMAL HIGH (ref <5)

## 2022-09-23 NOTE — ED Triage Notes (Addendum)
Pt presents ambulatory to triage via POV with complaints of vaginal bleeding that started tonight. Pt notes starting her period last Thursday having intermittent spotting that stopped yesterday. She notes having heavy bleeding starting ~30 mins ago; no clotting but has pressure in her rectum.  Of note, the patient had an abortion ~ a month ago. A&Ox4 at this time. Denies CP or SOB.

## 2022-09-24 ENCOUNTER — Emergency Department: Payer: Medicaid Other

## 2022-09-24 ENCOUNTER — Emergency Department
Admission: EM | Admit: 2022-09-24 | Discharge: 2022-09-24 | Disposition: A | Discharge status: Home / Self Care | Payer: Medicaid Other | Attending: Emergency Medicine | Admitting: Emergency Medicine

## 2022-09-24 DIAGNOSIS — N939 Abnormal uterine and vaginal bleeding, unspecified: Secondary | ICD-10-CM

## 2022-09-24 DIAGNOSIS — E876 Hypokalemia: Secondary | ICD-10-CM

## 2022-09-24 LAB — URINALYSIS, ROUTINE W REFLEX MICROSCOPIC
Bacteria, UA: NONE SEEN
Bilirubin Urine: NEGATIVE
Glucose, UA: NEGATIVE mg/dL
Ketones, ur: NEGATIVE mg/dL
Leukocytes,Ua: NEGATIVE
Nitrite: NEGATIVE
Protein, ur: NEGATIVE mg/dL
Specific Gravity, Urine: 1.005 (ref 1.005–1.030)
pH: 6 (ref 5.0–8.0)

## 2022-09-24 LAB — MAGNESIUM: Magnesium: 1.9 mg/dL (ref 1.7–2.4)

## 2022-09-24 MED ORDER — POTASSIUM CHLORIDE CRYS ER 20 MEQ PO TBCR
40.0000 meq | EXTENDED_RELEASE_TABLET | Freq: Once | ORAL | Status: AC
  Administered 2022-09-24: 40 meq via ORAL
  Filled 2022-09-24: qty 2

## 2022-09-24 MED ORDER — METHYLERGONOVINE MALEATE 0.2 MG PO TABS: 0.2000 mg | ORAL_TABLET | Freq: Four times a day (QID) | ORAL | 0 refills | Status: AC

## 2022-09-24 MED ORDER — METHYLERGONOVINE MALEATE 0.2 MG PO TABS
0.2000 mg | ORAL_TABLET | Freq: Once | ORAL | Status: AC
  Administered 2022-09-24: 0.2 mg via ORAL
  Filled 2022-09-24: qty 1

## 2022-09-24 MED ORDER — POTASSIUM CHLORIDE CRYS ER 20 MEQ PO TBCR: 40.0000 meq | EXTENDED_RELEASE_TABLET | Freq: Every day | ORAL | 0 refills | Status: DC

## 2022-09-24 MED ORDER — ONDANSETRON 4 MG PO TBDP: 4.0000 mg | ORAL_TABLET | Freq: Four times a day (QID) | ORAL | 0 refills | Status: DC | PRN

## 2022-09-24 NOTE — ED Provider Notes (Signed)
[facility] Provider Note    Event Date/Time   First MD Initiated Contact with Patient 09/24/22 0115     (approximate)   History   Vaginal Bleeding   HPI  Cathy [name] is a 32 y.o. female  G4P1 (one stillborn, 2 elective abortions) who presents to the emergency department with abnormal vaginal bleeding.  Patient states she underwent a D&[facility] [place] on 08/06/2022 when she was approximately 12 weeks, 6 days pregnant.  She states she feels she has had 2 periods since that time.  Her last menstrual period started on Thursday, March 28 and ended yesterday.  She states tonight she started having heavy vaginal bleeding, passing clots and then passed what she describes as tissue that look like a decidual cast.  She states her bleeding has slowed down significantly since that time and has almost stopped and is only spotting.  No abdominal pain.  No fevers.  No dysuria.  Does not have a local OB/GYN.  States she used to see [facility].  She does state that she has had unprotected sexual intercourse since her D&C, last being a week ago.   History provided by patient, mother.    Past Medical History:  Diagnosis Date   History of intrauterine fetal death, currently pregnant     Past Surgical History:  Procedure Laterality Date   TONSILLECTOMY      MEDICATIONS:  Prior to Admission medications   Medication Sig Start Date End Date Taking? Authorizing Provider  acetaminophen (TYLENOL) 325 MG tablet Take 2 tablets (650 mg total) by mouth every 4 (four) hours as needed (for pain scale < 4). 12/18/17   Schuman, Christanna R, MD  amLODipine (NORVASC) 10 MG tablet TAKE 1 TABLET BY MOUTH EVERY DAY Patient not taking: Reported on 02/21/2019 02/14/18   Natale MilchSchuman, Christanna R, MD  ferrous sulfate 325 (65 FE) MG EC tablet Take 325 mg by mouth 3 (three) times daily with meals. Patient not taking: Reported on 12/03/2020    [provider]  ibuprofen (ADVIL,MOTRIN) 600 MG tablet Take 1 tablet (600 mg total) by mouth every 6 (six) hours. Patient not taking: Reported on 02/21/2019 12/19/17   Natale MilchSchuman, Christanna R, MD  Prenatal Vit-Fe Fumarate-FA (MULTIVITAMIN-PRENATAL) 27-0.8 MG TABS tablet Take 1 tablet by mouth daily at 12 noon. Patient not taking: Reported on 12/03/2020    [provider]    Physical Exam   Triage Vital Signs: ED Triage Vitals  Enc Vitals Group     BP 09/23/22 2232 (!) 147/94     Pulse Rate 09/23/22 2232 (!) 113     Resp 09/23/22 2232 18     Temp 09/23/22 2232 97.7 F (36.5 C)     Temp Source 09/23/22 2232 Oral     SpO2 09/23/22 2232 100 %     Weight 09/23/22 2230 156 lb (70.8 kg)     Height 09/23/22 2230 5\' 5"  (1.651 m)     Head Circumference --      Peak Flow --      Pain Score --      Pain Loc --      Pain Edu? --      Excl. in GC? --     Most recent vital signs: Vitals:   09/23/22 2232 09/24/22 0228  BP: (!) 147/94 (!) 131/90  Pulse: (!) 113 (!) 106  Resp: 18 18  Temp: 97.7 F (36.5 C) 97.9 F (36.6  C)  SpO2: 100% 100%    CONSTITUTIONAL: Alert, responds appropriately to questions. Well-appearing; well-nourished HEAD: Normocephalic, atraumatic EYES: Conjunctivae clear, pupils appear equal, sclera nonicteric ENT: normal nose; moist mucous membranes NECK: Supple, normal ROM CARD: Regular and tachycardic; S1 and S2 appreciated RESP: Normal chest excursion without splinting or tachypnea; breath sounds clear and equal bilaterally; no wheezes, no rhonchi, no rales, no hypoxia or respiratory distress, speaking full sentences ABD/GI: Non-distended; soft, non-tender, no rebound, no guarding, no peritoneal signs GU:  patient declines BACK: The back appears normal EXT: Normal ROM in all joints; no deformity noted, no edema SKIN: Normal color for age and race; warm; no rash on exposed skin NEURO: Moves all extremities equally, normal speech PSYCH: The patient's mood and  manner are appropriate.   ED Results / Procedures / Treatments   LABS: (all labs ordered are listed, but only abnormal results are displayed) Labs Reviewed  CBC WITH DIFFERENTIAL/PLATELET - Abnormal; Notable for the following components:      Result Value   Hemoglobin 11.5 (*)    HCT 35.8 (*)    All other components within normal limits  COMPREHENSIVE METABOLIC PANEL - Abnormal; Notable for the following components:   Potassium 2.7 (*)    Glucose, Bld 146 (*)    All other components within normal limits  HCG, QUANTITATIVE, PREGNANCY - Abnormal; Notable for the following components:   hCG, Beta Chain, Quant, S 7 (*)    All other components within normal limits  URINALYSIS, ROUTINE W REFLEX MICROSCOPIC - Abnormal; Notable for the following components:   Color, Urine STRAW (*)    APPearance CLEAR (*)    Hgb urine dipstick LARGE (*)    All other components within normal limits  MAGNESIUM  TYPE AND SCREEN     EKG:  EKG Interpretation  Date/Time:  Friday September 24 2022 02:23:26 EDT Ventricular Rate:  116 PR Interval:  170 QRS Duration: 76 QT Interval:  262 QTC Calculation: 364 R Axis:   77 Text Interpretation: Sinus tachycardia Nonspecific T wave abnormality Abnormal ECG No previous ECGs available Confirmed by Rochele Raring [phone]) on 09/24/2022 2:43:37 AM         RADIOLOGY: My personal review and interpretation of imaging: Ultrasound shows thickened endometrium with hypervascularity.  I have personally reviewed all radiology reports.   US OB LESS THAN 14 WEEKS W/ OB TRANSVAGINAL AND DOPPLER  Result Date: 09/24/2022 CLINICAL DATA:  [id number] with pelvic pain and heavy vaginal bleeding. EXAM: OBSTETRIC <14 WK [place] AND TRANSVAGINAL OB US DOPPLER ULTRASOUND OF OVARIES TECHNIQUE: Both transabdominal and transvaginal ultrasound examinations were performed for complete evaluation of the gestation as well as the maternal uterus, adnexal regions, and pelvic cul-de-sac. Transvaginal  technique was performed to as Sess early pregnancy. Color and duplex Doppler ultrasound was utilized to evaluate blood flow to the ovaries. COMPARISON:  Prior OB ultrasound 11/28/2020. FINDINGS: Intrauterine gestational sac: None. Yolk sac:  Not Visualized. Embryo:  Not Visualized. Cardiac Activity: N/a. MSD: None. CRL: None. Subchorionic hemorrhage:  None visualized. Maternal uterus/adnexae: Enlarged anteverted uterus, volume 145.6 mL. No visible wall mass. The cervix is closed. There is a heterogeneous thickened endometrium measuring 3.3 cm in thickness with a few scattered cystic spaces and increased vascularity. Findings could be due to retained products of conception from the last pregnancy or a molar pregnancy if this is a new pregnancy. Right ovary contains a 2 cm dominant simple follicle and is otherwise unremarkable with a volume of 10.6 mL. The left  ovary is unremarkable and has a volume of 4.3 mL. No free fluid or paraovarian mass is seen. There is left pelvic venous congestion. Pulsed Doppler evaluation of both ovaries demonstrates normal appearing low-resistance arterial and venous waveforms. IMPRESSION: 1. Heterogeneous thickened endometrium with increased vascularity and a few tiny cystic spaces. No gestational sac or living pregnancy is seen. 2. Differential diagnosis is mainly between retained products of conception from the last pregnancy, or a molar pregnancy if this is a new pregnancy. 3. Left pelvic venous congestion. Electronically Signed   By: Almira BarKeith  Chesser M.D.   On: 09/24/2022 02:35     PROCEDURES:  Critical Care performed: No      Procedures    IMPRESSION / MDM / ASSESSMENT AND PLAN / ED COURSE  I reviewed the triage vital signs and the nursing notes.    Patient here with vaginal bleeding after D&C 2 months ago.     DIFFERENTIAL DIAGNOSIS (includes but not limited to):   Dysfunctional uterine bleeding, new pregnancy, retained products of conception, doubt  endometritis, sepsis   Patient's presentation is most consistent with acute presentation with potential threat to life or bodily function.   PLAN: Labs obtained from triage.  Hemoglobin of 11.5.  No leukocytosis.  Potassium low at 2.7 without EKG changes.  Will check magnesium level.  Will give oral replacement.  hCG level is only 7.  Discussed with patient that this is reassuring.  Her urine shows some red blood cells but this is likely from vaginal bleeding.  Transvaginal ultrasound read ordered from triage currently pending.   MEDICATIONS GIVEN IN ED: Medications  potassium chloride SA (KLOR-CON M) CR tablet 40 mEq (40 mEq Oral Given 09/24/22 0218)  methylergonovine (METHERGINE) tablet 0.2 mg (0.2 mg Oral Given 09/24/22 0316)     ED COURSE: Magnesium level normal.  Transvaginal ultrasound reviewed and interpreted by myself and the radiologist and shows heterogeneous thickened endometrium with increased vascularity.  Discussed with radiologist who suggests this could be retained products of conception versus molar pregnancy versus endometrial malignancy.  Low suspicion for retained products of conception given hCG is only 7 and her D&C was 2 months ago.  Did discuss with patient given she is still having unprotected sex that this could be a very early new pregnancy given how lower her hCG is.  She states that her bleeding is under stopped and is not having any abdominal pain, fevers here.  She declines pelvic exam.  She states if this was retained products of conception she would not be interested in another D&C at this time.  She does not currently have a local OB/GYN.  Will talk to our OB/GYN on-call for further recommendations.   CONSULTS: Discussed with Dr. Feliberto GottronSchermerhorn with OB/GYN.  Appreciate his help.  He recommends starting patient on Methergine 0.2 mg 1 tablet p.o. 4 times daily for 2 days.  Will give first dose here in the ED.  He recommends obtaining an ultrasound in 1 week which I have  placed the order for to be done at the hospital as an outpatient.  He would like to follow-up with patient in his office after the ultrasound has been completed.  Patient has been provided with the outpatient follow-up information and instructed to call to schedule an appointment.  We discussed at length bleeding return precautions.  Patient and mother verbalized understanding.  I feel she is safe for discharge.   At this time, I do not feel there is any life-threatening condition present.  I reviewed all nursing notes, vitals, pertinent previous records.  All lab and urine results, EKGs, imaging ordered have been independently reviewed and interpreted by myself.  I reviewed all available radiology reports from any imaging ordered this visit.  Based on my assessment, I feel the patient is safe to be discharged home without further emergent workup and can continue workup as an outpatient as needed. Discussed all findings, treatment plan as well as usual and customary return precautions.  They verbalize understanding and are comfortable with this plan.  Outpatient follow-up has been provided as needed.  All questions have been answered.    OUTSIDE RECORDS REVIEWED: Reviewed patient's last elective abortion at [facility] in February 2023 at [redacted] weeks gestation.       FINAL CLINICAL IMPRESSION(S) / ED DIAGNOSES   Final diagnoses:  Abnormal uterine bleeding (AUB)  Hypokalemia     Rx / DC Orders   ED Discharge Orders          Ordered    US PELVIC COMPLETE WITH TRANSVAGINAL        09/24/22 0302    methylergonovine (METHERGINE) 0.2 MG tablet  4 times daily        09/24/22 0305    potassium chloride SA (KLOR-CON M) 20 MEQ tablet  Daily        09/24/22 0306    ondansetron (ZOFRAN-ODT) 4 MG disintegrating tablet  Every 6 hours PRN        09/24/22 0306    Ambulatory Referral to Primary Care (Establish Care)        09/24/22 0306             Note:  This document was prepared using Dragon voice  recognition software and may include unintentional dictation errors.   Stacie Templin, Layla Maw, DO 09/24/22 [phone]

## 2022-09-24 NOTE — Discharge Instructions (Addendum)
I have placed an order to have an ultrasound scheduled at the hospital as an outpatient in 1 week from now.  The OB/GYN would like to see you after this ultrasound has been completed.  Please call to schedule an appointment.  Please take your Methergine as prescribed for the next 2 days.  You may alternate Tylenol 1000 mg every 6 hours as needed for pain, fever and Ibuprofen 800 mg every 6-8 hours as needed for pain, fever.  Please take Ibuprofen with food.  Do not take more than 4000 mg of Tylenol (acetaminophen) in a 24 hour period.  Your potassium level today was also low at 2.7.  You will need to have this rechecked by a primary care doctor in 1 to 2 weeks.  We have placed a referral for a local PCP.   Please go to the following website to schedule new (and existing) patient appointments:   [email or web address]   The following is a list of primary care offices in the area who are accepting new patients at this time.  Please reach out to one of them directly and let them know you would like to schedule an appointment to follow up on an Emergency Department visit, and/or to establish a new primary care provider (PCP).  There are likely other primary care clinics in the are who are accepting new patients, but this is an excellent place to start:  [facility] Lead physician: Dr Shirlee Latch [address] [place], [place] [address] [phone]  [facility] Lead Physician: Dr Alba Cory [address], [place], [place] [address] [phone]  [facility]  Lead Physician: Dr Olevia Perches [address] [place], [place] [address] [phone]  [facility] Lead Physician: Dr Sofie Hartigan [address] [place], [place] [address] [phone]  [facility] Lead Physician: Dr Bari Edward [address] [place], [place], [place] [address] [phone]

## 2024-05-28 DIAGNOSIS — O0993 Supervision of high risk pregnancy, unspecified, third trimester: Secondary | ICD-10-CM | POA: Insufficient documentation

## 2024-05-29 ENCOUNTER — Observation Stay

## 2024-05-29 ENCOUNTER — Observation Stay: Admission: EM | Admit: 2024-05-29 | Discharge: 2024-05-29 | Disposition: A | Discharge status: Home / Self Care

## 2024-05-29 ENCOUNTER — Encounter: Payer: Self-pay | Admitting: Emergency Medicine

## 2024-05-29 ENCOUNTER — Other Ambulatory Visit: Payer: Self-pay

## 2024-05-29 DIAGNOSIS — O4690 Antepartum hemorrhage, unspecified, unspecified trimester: Secondary | ICD-10-CM | POA: Diagnosis present

## 2024-05-29 DIAGNOSIS — O0930 Supervision of pregnancy with insufficient antenatal care, unspecified trimester: Secondary | ICD-10-CM

## 2024-05-29 DIAGNOSIS — O471 False labor at or after 37 completed weeks of gestation: Secondary | ICD-10-CM | POA: Diagnosis not present

## 2024-05-29 DIAGNOSIS — Z3A36 36 weeks gestation of pregnancy: Secondary | ICD-10-CM | POA: Diagnosis not present

## 2024-05-29 DIAGNOSIS — O26853 Spotting complicating pregnancy, third trimester: Secondary | ICD-10-CM | POA: Diagnosis not present

## 2024-05-29 DIAGNOSIS — O99013 Anemia complicating pregnancy, third trimester: Secondary | ICD-10-CM | POA: Diagnosis present

## 2024-05-29 DIAGNOSIS — B9689 Other specified bacterial agents as the cause of diseases classified elsewhere: Secondary | ICD-10-CM | POA: Diagnosis present

## 2024-05-29 LAB — PROTEIN / CREATININE RATIO, URINE
Creatinine, Urine: 59 mg/dL
Protein Creatinine Ratio: 0.13 mg/mg{creat} (ref 0.00–0.15)
Total Protein, Urine: 8 mg/dL

## 2024-05-29 LAB — DIFFERENTIAL
Abs Immature Granulocytes: 0.05 K/uL (ref 0.00–0.07)
Basophils Absolute: 0 K/uL (ref 0.0–0.1)
Basophils Relative: 0 %
Eosinophils Absolute: 0 K/uL (ref 0.0–0.5)
Eosinophils Relative: 0 %
Immature Granulocytes: 1 %
Lymphocytes Relative: 11 %
Lymphs Abs: 0.9 K/uL (ref 0.7–4.0)
Monocytes Absolute: 0.5 K/uL (ref 0.1–1.0)
Monocytes Relative: 6 %
Neutro Abs: 6.3 K/uL (ref 1.7–7.7)
Neutrophils Relative %: 82 %

## 2024-05-29 LAB — WET PREP, GENITAL
Sperm: NONE SEEN
Trich, Wet Prep: NONE SEEN
WBC, Wet Prep HPF POC: <10 (ref <10)
Yeast Wet Prep HPF POC: NONE SEEN

## 2024-05-29 LAB — HEMOGLOBIN A1C
Hgb A1c MFr Bld: 4.6 % — ABNORMAL LOW (ref 4.8–5.6)
Mean Plasma Glucose: 85.32 mg/dL

## 2024-05-29 LAB — TYPE AND SCREEN
ABO/RH(D): O POS
Antibody Screen: NEGATIVE

## 2024-05-29 LAB — URINALYSIS, ROUTINE W REFLEX MICROSCOPIC
Bilirubin Urine: NEGATIVE
Glucose, UA: NEGATIVE mg/dL
Hgb urine dipstick: NEGATIVE
Ketones, ur: NEGATIVE mg/dL
Leukocytes,Ua: NEGATIVE
Nitrite: NEGATIVE
Protein, ur: NEGATIVE mg/dL
Specific Gravity, Urine: 1.008 (ref 1.005–1.030)
pH: 7 (ref 5.0–8.0)

## 2024-05-29 LAB — RAPID HIV SCREEN (HIV 1/2 AB+AG)
HIV 1/2 Antibodies: NONREACTIVE
HIV-1 P24 Antigen - HIV24: NONREACTIVE

## 2024-05-29 LAB — CBC
HCT: 30.5 % — ABNORMAL LOW (ref 36.0–46.0)
Hemoglobin: 10.2 g/dL — ABNORMAL LOW (ref 12.0–15.0)
MCH: 28.8 pg (ref 26.0–34.0)
MCHC: 33.4 g/dL (ref 30.0–36.0)
MCV: 86.2 fL (ref 80.0–100.0)
Platelets: 215 K/uL (ref 150–400)
RBC: 3.54 MIL/uL — ABNORMAL LOW (ref 3.87–5.11)
RDW: 13 % (ref 11.5–15.5)
WBC: 7.7 K/uL (ref 4.0–10.5)
nRBC: 0 % (ref 0.0–0.2)

## 2024-05-29 LAB — URINE DRUG SCREEN
Amphetamines: NEGATIVE
Barbiturates: NEGATIVE
Benzodiazepines: NEGATIVE
Cocaine: NEGATIVE
Fentanyl: NEGATIVE
Methadone Scn, Ur: NEGATIVE
Opiates: NEGATIVE
Tetrahydrocannabinol: NEGATIVE

## 2024-05-29 LAB — TSH: TSH: 0.896 u[IU]/mL (ref 0.350–4.500)

## 2024-05-29 LAB — HEPATITIS B SURFACE ANTIGEN: Hepatitis B Surface Ag: NONREACTIVE

## 2024-05-29 MED ORDER — CALCIUM CARBONATE ANTACID 500 MG PO CHEW: 2.0000 | CHEWABLE_TABLET | ORAL | Status: DC | PRN

## 2024-05-29 MED ORDER — METRONIDAZOLE 500 MG PO TABS: 500.0000 mg | ORAL_TABLET | Freq: Two times a day (BID) | ORAL | 0 refills | Status: AC

## 2024-05-29 MED ORDER — ACETAMINOPHEN 325 MG PO TABS: 650.0000 mg | ORAL_TABLET | ORAL | Status: DC | PRN

## 2024-05-29 MED ORDER — CALCIUM CARBONATE ANTACID 500 MG PO CHEW: 2.0000 | CHEWABLE_TABLET | ORAL | Status: AC | PRN

## 2024-05-29 NOTE — OB Triage Note (Signed)
 Presents with complaint of vaginal spotting noted when she wiped after using bathroom this morning.  States she also had some spotting on Saturday morning. Denies sexual intercourse. Denies any leaking of fluid,or pain. Patient has not had any PNC up to this point. Has appointment at [facility] on Monday. EFMs applied.

## 2024-05-29 NOTE — ED Triage Notes (Addendum)
 Presents with vaginal spotting and abd tightening since Friday.  G4 P1 Patient is pregnant. LMP:09/21/2023.  Has not had prenatal care. Scheuled for a first OB appointment next Monday at [facility] with US .  States + fetal movement.  Feels abd pressure, but denies desire to push. LBM:  05/29/2024  AAOx3. Skin warm and dry. NAD

## 2024-05-29 NOTE — Progress Notes (Signed)
 Pt taken to ultrasound

## 2024-05-29 NOTE — OB Triage Provider Note (Signed)
 LABOR & DELIVERY OB TRIAGE NOTE  SUBJECTIVE  HPI Cathy [name] is a 33 y.o. G4P1101 at [redacted]w[redacted]d who presents to Labor & Delivery for evaluation of vaginal spotting. She first noticed that on 12/6 after a busy day at home. It happened again today, and she decided to come and be evaluated. She also has been having some constipation. No recent IC.  She has not yet had prenatal care this pregnancy. She is scheduled for her initial prenatal visit on 12/15 at [facility].  Her LMP was sometime in April.   OB History     Gravida  4   Para  2   Term  1   Preterm  1   AB      Living  1      SAB      IAB      Ectopic      Multiple  0   Live Births  1           Meds: Takes prenatal gummy that doesn't contain iron. Just picked up some OTC chelated iron 18mg . PRN Meds:.acetaminophen , calcium  carbonate  OBJECTIVE  BP 138/78   Pulse (!) 109   Resp 16   Wt 78.5 kg   LMP 09/21/2023   SpO2 100%   BMI 28.79 kg/m   General: NAD, conversant Heart: Well perfused Lungs: Normal WOB Abdomen: Gravid, NT Cervical exam:   Deferred  Fetal monitoring Baseline: 150 Variability: moderate Accelerations: Present Decelerations:none Toco: Quiet  Results for orders placed or performed during the hospital encounter of 05/29/24 (from the past 24 hours)  Urinalysis, Routine w reflex microscopic -Urine, Clean Catch     Status: Abnormal   Collection Time: 05/29/24  1:22 PM  Result Value Ref Range   Color, Urine STRAW (A) YELLOW   APPearance CLEAR (A) CLEAR   Specific Gravity, Urine 1.008 1.005 - 1.030   pH 7.0 5.0 - 8.0   Glucose, UA NEGATIVE NEGATIVE mg/dL   Hgb urine dipstick NEGATIVE NEGATIVE   Bilirubin Urine NEGATIVE NEGATIVE   Ketones, ur NEGATIVE NEGATIVE mg/dL   Protein, ur NEGATIVE NEGATIVE mg/dL   Nitrite NEGATIVE NEGATIVE   Leukocytes,Ua NEGATIVE NEGATIVE  Urine Drug Screen     Status: None   Collection Time: 05/29/24  1:22 PM  Result Value Ref Range   Opiates  NEGATIVE NEGATIVE   Cocaine NEGATIVE NEGATIVE   Benzodiazepines NEGATIVE NEGATIVE   Amphetamines NEGATIVE NEGATIVE   Tetrahydrocannabinol NEGATIVE NEGATIVE   Barbiturates NEGATIVE NEGATIVE   Methadone Scn, Ur NEGATIVE NEGATIVE   Fentanyl  NEGATIVE NEGATIVE  CBC     Status: Abnormal   Collection Time: 05/29/24  2:17 PM  Result Value Ref Range   WBC 7.7 4.0 - 10.5 K/uL   RBC 3.54 (L) 3.87 - 5.11 MIL/uL   Hemoglobin 10.2 (L) 12.0 - 15.0 g/dL   HCT 69.4 (L) 63.9 - 53.9 %   MCV 86.2 80.0 - 100.0 fL   MCH 28.8 26.0 - 34.0 pg   MCHC 33.4 30.0 - 36.0 g/dL   RDW 86.9 88.4 - 84.4 %   Platelets 215 150 - 400 K/uL   nRBC 0.0 0.0 - 0.2 %  Differential     Status: None   Collection Time: 05/29/24  2:17 PM  Result Value Ref Range   Neutrophils Relative % 82 %   Neutro Abs 6.3 1.7 - 7.7 K/uL   Lymphocytes Relative 11 %   Lymphs Abs 0.9 0.7 - 4.0 K/uL   Monocytes  Relative 6 %   Monocytes Absolute 0.5 0.1 - 1.0 K/uL   Eosinophils Relative 0 %   Eosinophils Absolute 0.0 0.0 - 0.5 K/uL   Basophils Relative 0 %   Basophils Absolute 0.0 0.0 - 0.1 K/uL   Immature Granulocytes 1 %   Abs Immature Granulocytes 0.05 0.00 - 0.07 K/uL  Rapid HIV screen (HIV 1/2 Ab+Ag)     Status: None   Collection Time: 05/29/24  2:17 PM  Result Value Ref Range   HIV-1 P24 Antigen - HIV24 NON REACTIVE NON REACTIVE   HIV 1/2 Antibodies NON REACTIVE NON REACTIVE   Interpretation (HIV Ag Ab)      A non reactive test result means that HIV 1 or HIV 2 antibodies and HIV 1 p24 antigen were not detected in the specimen.  TSH     Status: None   Collection Time: 05/29/24  2:17 PM  Result Value Ref Range   TSH 0.896 0.350 - 4.500 uIU/mL  Type and screen [facility]     Status: None   Collection Time: 05/29/24  2:20 PM  Result Value Ref Range   ABO/RH(D) O POS    Antibody Screen NEG    Sample Expiration 06/01/2024,2359    Extend sample reason      NO TRANSFUSIONS OR PREGNANCY IN THE PAST 3  MONTHS Performed at [facility], [address] [place], [place] [address]   Hemoglobin A1c     Status: Abnormal   Collection Time: 05/29/24  2:20 PM  Result Value Ref Range   Hgb A1c MFr Bld 4.6 (L) 4.8 - 5.6 %   Mean Plasma Glucose 85.32 mg/dL  Wet prep, genital     Status: Abnormal   Collection Time: 05/29/24  5:04 PM   Specimen: Vaginal  Result Value Ref Range   Yeast Wet Prep HPF POC NONE SEEN NONE SEEN   Trich, Wet Prep NONE SEEN NONE SEEN   Clue Cells Wet Prep HPF POC PRESENT (A) NONE SEEN   WBC, Wet Prep HPF POC <10 <10   Sperm NONE SEEN    US  OB Comp + 14 Wk Result Date: 05/29/2024 CLINICAL DATA:  Unknown gestational age, no prenatal care EXAM: OBSTETRICAL ULTRASOUND >14 WKS FINDINGS: Number of Fetuses: 1 Heart Rate:  144 bpm Movement: Yes Presentation: Cephalic Previa: No Placental Location: Fundal and posterior Amniotic Fluid (Subjective): Normal Amniotic Fluid (Objective): Vertical pocket = 5.2cm AFI = 13.0 cm (5%ile= 8.8 cm, 95%= 23.8 cm for 31 wks) FETAL BIOMETRY BPD: 8.0cm 31w 6d HC:   29.0cm 32w 0d AC:   26.6cm 30w 5d FL:   6.0cm 31w 0d Current Mean GA: 31w 3d US  EDC: 07/28/2024 Assigned GA: Unknown Estimated Fetal Weight:  1683.4g 50%ile FETAL ANATOMY Lateral Ventricles: Appears normal Thalami/CSP: Appears normal Posterior Fossa:  Appears normal Nuchal Region: Limited views appear normal   NFT= N/A > 20 WKS Upper Lip: Appears normal Spine: Appears normal 4 Chamber Heart on Left: Appears normal LVOT: Appears normal RVOT: Appears normal Stomach on Left: Appears normal 3 Vessel Cord: Appears normal Cord Insertion site: Appears normal Kidneys: Appears normal Bladder: Appears normal Extremities: Appears normal Sex: Female Technically difficult due to: Advanced gestational age and fetal position Maternal Findings: Cervix:  4.0 cm IMPRESSION: 1. Single live intrauterine pregnancy as above, estimated age 103 weeks and 3 days. 2. Estimated fetal weight at the 50th percentile  assuming [redacted] week gestation. 3. Normal fetal anatomic survey. Electronically Signed   By: Ozell  Delores M.D.   On: 05/29/2024 16:55    ASSESSMENT/ PLAN 1) Pregnancy at [id number], [redacted]w[redacted]d dated by today's ultrasound. Estimated Date of Delivery: 07/28/24. This was noted in her chart. I reviewed with her the margin of error with ultrasound dating in the third trimester can be 2-3 weeks. Reviewed kick counts and preterm labor warning signs. Instructed to call office or come to hospital with persistent headache, vision changes, regular contractions, leaking of fluid, decreased fetal movement or vaginal bleeding.  2) Reassuring maternal/fetal status. Cat 1 FHR tracing.  3) Bacterial vaginosis- tx with po Flagyl  500mg  bid x 7 days. This is likely cause of her vaginal bleeding.  4) Anemia of pregnancy. Patient has not been taking prenatal vitamin that contains iron. Encouraged to take daily iron supplement that she has just purchased. Discussed possibility of having Hgb rechecked in approx 4 wks to ensure improvement. Encouraged taking fe with Vit C- rich food. Avoiding dairy 30 mins before and after taking iron.  5) Routine prenatal labs that have resulted thus far are WNL. Ultrasound today demonstrated normal fetal anatomy and NO PREVIA.   6) Hx possible CHTN vs GHTN. Not currently on meds. Will attempt to add on to labs already drawn.   7) Late entry to prenatal care at 31 weeks. Has NOB at [facility] scheduled for 12/15. Encouraged to keep this appointment.    Lauraine Lakes, CNM 05/29/24  7:18 PM

## 2024-05-29 NOTE — ED Provider Notes (Signed)
 [facility] Provider Note    Event Date/Time   First MD Initiated Contact with Patient 05/29/24 1040     (approximate)   History   Vaginal Bleeding   HPI  Cathy [name] is a 33 y.o. female G4, P1 last known menstrual period of April 2025, presenting to the emergency department complaining of lower abdominal tightening and vaginal bleeding.  Patient reports that she has noticed small amount of blood when wiping for the last few days.  She also reports that she is having a tightening sensation in her lower abdomen that comes and goes.  She does not feel any need to push.  She is uncertain if this feels like contractions but does not think that it feels like [employer].  She denies any fluid leakage or gush of fluid.  She reports that she has not had any prenatal care and so is uncertain exactly how far along she is.  She reports that she does feel regular fetal movement.     Physical Exam   Triage Vital Signs: ED Triage Vitals  Encounter Vitals Group     BP 05/29/24 1026 (!) 140/97     Girls Systolic BP Percentile --      Girls Diastolic BP Percentile --      Boys Systolic BP Percentile --      Boys Diastolic BP Percentile --      Pulse Rate 05/29/24 1026 (!) 110     Resp 05/29/24 1026 16     Temp --      Temp Source 05/29/24 1026 Oral     SpO2 05/29/24 1026 100 %     Weight 05/29/24 1027 173 lb (78.5 kg)     Height --      Head Circumference --      Peak Flow --      Pain Score 05/29/24 1027 0     Pain Loc --      Pain Education --      Exclude from Growth Chart --     Most recent vital signs: Vitals:   05/29/24 1026  BP: (!) 140/97  Pulse: (!) 110  Resp: 16  SpO2: 100%     General: Awake, no distress.  CV:  Good peripheral perfusion.  Resp:  Normal effort.  Abd:  Gravid uterus consistent with a 7 to 68-month gestation, nontender to palpation    ED Results / Procedures / Treatments   Labs (all labs ordered are listed, but only  abnormal results are displayed) Labs Reviewed - No data to display    PROCEDURES:  Critical Care performed: No  Procedures   MEDICATIONS ORDERED IN ED: Medications - No data to display   IMPRESSION / MDM / ASSESSMENT AND PLAN / ED COURSE  I reviewed the triage vital signs and the nursing notes.                              Differential diagnosis includes, but is not limited to, labor, premature rupture of membranes, placenta previa  Patient's presentation is most consistent with acute presentation with potential threat to life or bodily function.    This patient is a 33 year old female G20, P70 with a last menstrual period April 2025 presenting to the emergency department complaining of lower abdominal tightening and vaginal bleeding.  Bedside ultrasound was performed which did confirm an intrauterine pregnancy consistent with 7 to 9-month gestation.  Fetal  heart tones in the 150s.  Good fetal movement.  Discussed with the patient that I would likely discharge her to go straight to [facility] triage for further evaluation.  Patient is agreeable.  I did discuss patient's case with Edsel Portugal (certified midwife), who is agreeable with seeing the patient in [facility] triage.  Patient was discharged to go directly to [facility] triage.   FINAL CLINICAL IMPRESSION(S) / ED DIAGNOSES   Final diagnoses:  Vaginal bleeding in pregnancy     Rx / DC Orders   ED Discharge Orders     None        Note:  This document was prepared using Dragon voice recognition software and may include unintentional dictation errors.   Rexford Reche HERO, MD 05/29/24 [id number]

## 2024-05-30 LAB — RUBELLA SCREEN: Rubella: <0.9 {index} — ABNORMAL LOW (ref >0.99)

## 2024-05-30 LAB — SYPHILIS: RPR W/REFLEX TO RPR TITER AND TREPONEMAL ANTIBODIES, TRADITIONAL SCREENING AND DIAGNOSIS ALGORITHM: RPR Ser Ql: NONREACTIVE

## 2024-06-20 DIAGNOSIS — Z2839 Other underimmunization status: Secondary | ICD-10-CM | POA: Insufficient documentation

## 2024-06-20 DIAGNOSIS — Z8759 Personal history of other complications of pregnancy, childbirth and the puerperium: Secondary | ICD-10-CM | POA: Insufficient documentation

## 2024-06-20 DIAGNOSIS — O09299 Supervision of pregnancy with other poor reproductive or obstetric history, unspecified trimester: Secondary | ICD-10-CM | POA: Insufficient documentation

## 2024-06-21 NOTE — L&D Delivery Note (Signed)
 Delivery Note  Date of delivery: 07/14/2024 Estimated Date of Delivery: 07/28/24 Patient's last menstrual period was 09/21/2023. EGA: [redacted]w[redacted]d  Delivery Note At 3:15 PM a viable female was delivered via Vaginal, Spontaneous (Presentation: Left Occiput Anterior).  APGAR: 8, 9; weight pending.  Placenta status: Spontaneous, Intact.  Cord: 3 vessels with the following complications: None.  Cord pH: n/a  First Stage: Labor onset: unknown Induction : Cytotec , AROM, and Pitocin  Analgesia /Anesthesia intrapartum: Epidural AROM at 1452  Cathy [name] presented to L&D for IOL. She was induced with Cytotec , AROM and pitocin . Epidural placed for pain relief.   Second Stage: Complete dilation at 1306 Onset of pushing at 1306 FHR second stage Cat I Delivery at 1515 on 07/14/2024  She progressed to complete and had a spontaneous vaginal birth of a live female over an intact perineum. The fetal head was delivered in OA position with restitution to LOA. No nuchal cord. Anterior then posterior shoulders delivered spontaneously with minimal assistance. Baby placed on mom's abdomen and attended to by transition RN. Cord clamped and cut after 1+ min by FOB. Cord blood obtained for newborn labs.  Third Stage: Placenta delivered intact with 3VC at 1520 Placenta disposition: routine disposal  Uterine tone firm / bleeding min IV pitocin  given for hemorrhage prophylaxis  Anesthesia: Epidural Episiotomy: None Lacerations: 1st degree;Vaginal Suture Repair: 2.0 vicryl Est. Blood Loss (mL):  150  Complications: none  Mom to postpartum.  Baby to Couplet care / Skin to Skin.  Newborn: Birth Weight: pending  Apgar Scores: 8, 9 Feeding planned: bottle   Margery FORBES Coe, CNM 07/14/2024 3:35 PM

## 2024-07-02 ENCOUNTER — Other Ambulatory Visit: Payer: Self-pay | Admitting: Obstetrics and Gynecology

## 2024-07-02 DIAGNOSIS — Z3490 Encounter for supervision of normal pregnancy, unspecified, unspecified trimester: Secondary | ICD-10-CM

## 2024-07-02 LAB — OB RESULTS CONSOLE GC/CHLAMYDIA
Chlamydia: NEGATIVE
Neisseria Gonorrhea: NEGATIVE

## 2024-07-02 LAB — OB RESULTS CONSOLE GBS: GBS: POSITIVE

## 2024-07-09 ENCOUNTER — Inpatient Hospital Stay
Admission: AD | Admit: 2024-07-09 | Discharge: 2024-07-11 | DRG: 833 | Disposition: A | Discharge status: Home / Self Care | Attending: Obstetrics and Gynecology | Admitting: Obstetrics and Gynecology

## 2024-07-09 ENCOUNTER — Other Ambulatory Visit: Payer: Self-pay

## 2024-07-09 ENCOUNTER — Encounter: Payer: Self-pay | Admitting: Obstetrics and Gynecology

## 2024-07-09 DIAGNOSIS — Z3A37 37 weeks gestation of pregnancy: Secondary | ICD-10-CM | POA: Diagnosis not present

## 2024-07-09 DIAGNOSIS — O99820 Streptococcus B carrier state complicating pregnancy: Secondary | ICD-10-CM | POA: Diagnosis present

## 2024-07-09 DIAGNOSIS — O99013 Anemia complicating pregnancy, third trimester: Secondary | ICD-10-CM

## 2024-07-09 DIAGNOSIS — O0930 Supervision of pregnancy with insufficient antenatal care, unspecified trimester: Principal | ICD-10-CM

## 2024-07-09 DIAGNOSIS — Z3490 Encounter for supervision of normal pregnancy, unspecified, unspecified trimester: Secondary | ICD-10-CM | POA: Diagnosis present

## 2024-07-09 DIAGNOSIS — O26893 Other specified pregnancy related conditions, third trimester: Secondary | ICD-10-CM | POA: Diagnosis present

## 2024-07-09 DIAGNOSIS — O09523 Supervision of elderly multigravida, third trimester: Secondary | ICD-10-CM | POA: Diagnosis not present

## 2024-07-09 DIAGNOSIS — B9689 Other specified bacterial agents as the cause of diseases classified elsewhere: Secondary | ICD-10-CM

## 2024-07-09 DIAGNOSIS — Z7982 Long term (current) use of aspirin: Secondary | ICD-10-CM

## 2024-07-09 LAB — TYPE AND SCREEN
ABO/RH(D): O POS
Antibody Screen: NEGATIVE

## 2024-07-09 LAB — CBC
HCT: 29.7 % — ABNORMAL LOW (ref 36.0–46.0)
Hemoglobin: 10.2 g/dL — ABNORMAL LOW (ref 12.0–15.0)
MCH: 29.4 pg (ref 26.0–34.0)
MCHC: 34.3 g/dL (ref 30.0–36.0)
MCV: 85.6 fL (ref 80.0–100.0)
Platelets: 195 K/uL (ref 150–400)
RBC: 3.47 MIL/uL — ABNORMAL LOW (ref 3.87–5.11)
RDW: 13.6 % (ref 11.5–15.5)
WBC: 6.7 K/uL (ref 4.0–10.5)
nRBC: 0 % (ref 0.0–0.2)

## 2024-07-09 MED ORDER — ONDANSETRON HCL 4 MG/2ML IJ SOLN: 4.0000 mg | Freq: Four times a day (QID) | INTRAMUSCULAR | Status: DC | PRN

## 2024-07-09 MED ORDER — PENICILLIN G POT IN DEXTROSE 60000 UNIT/ML IV SOLN
3.0000 10*6.[IU] | INTRAVENOUS | Status: DC
  Administered 2024-07-09 – 2024-07-11 (×10): 3 10*6.[IU] via INTRAVENOUS
  Filled 2024-07-09 (×10): qty 50

## 2024-07-09 MED ORDER — OXYTOCIN-SODIUM CHLORIDE 30-0.9 UT/500ML-% IV SOLN
2.5000 [IU]/h | INTRAVENOUS | Status: DC
  Filled 2024-07-09: qty 500

## 2024-07-09 MED ORDER — SODIUM CHLORIDE 0.9 % IV SOLN
5.0000 10*6.[IU] | Freq: Once | INTRAVENOUS | Status: AC
  Administered 2024-07-09: 5 10*6.[IU] via INTRAVENOUS
  Filled 2024-07-09 (×2): qty 5

## 2024-07-09 MED ORDER — LIDOCAINE HCL (PF) 1 % IJ SOLN
INTRAMUSCULAR | Status: AC
  Filled 2024-07-09: qty 30

## 2024-07-09 MED ORDER — OXYTOCIN-SODIUM CHLORIDE 30-0.9 UT/500ML-% IV SOLN
1.0000 m[IU]/min | INTRAVENOUS | Status: DC
  Administered 2024-07-09: 1 m[IU]/min via INTRAVENOUS
  Administered 2024-07-10: 2 m[IU]/min via INTRAVENOUS

## 2024-07-09 MED ORDER — OXYCODONE-ACETAMINOPHEN 5-325 MG PO TABS: 1.0000 | ORAL_TABLET | ORAL | Status: DC | PRN

## 2024-07-09 MED ORDER — MISOPROSTOL 25 MCG QUARTER TABLET
25.0000 ug | ORAL_TABLET | ORAL | Status: DC | PRN
  Administered 2024-07-10 (×2): 25 ug via VAGINAL
  Filled 2024-07-09 (×2): qty 1

## 2024-07-09 MED ORDER — OXYCODONE-ACETAMINOPHEN 5-325 MG PO TABS: 2.0000 | ORAL_TABLET | ORAL | Status: DC | PRN

## 2024-07-09 MED ORDER — AMMONIA AROMATIC IN INHA
RESPIRATORY_TRACT | Status: AC
  Filled 2024-07-09: qty 10

## 2024-07-09 MED ORDER — LACTATED RINGERS IV SOLN: 500.0000 mL | INTRAVENOUS | Status: AC | PRN

## 2024-07-09 MED ORDER — TERBUTALINE SULFATE 1 MG/ML IJ SOLN: 0.2500 mg | Freq: Once | INTRAMUSCULAR | Status: DC | PRN

## 2024-07-09 MED ORDER — LACTATED RINGERS IV SOLN: INTRAVENOUS | Status: AC

## 2024-07-09 MED ORDER — MISOPROSTOL 50MCG HALF TABLET
50.0000 ug | ORAL_TABLET | Freq: Once | ORAL | Status: AC
  Administered 2024-07-09: 50 ug via ORAL
  Filled 2024-07-09: qty 1

## 2024-07-09 MED ORDER — MISOPROSTOL 50MCG HALF TABLET
50.0000 ug | ORAL_TABLET | Freq: Once | ORAL | Status: AC
  Administered 2024-07-09: 50 ug via VAGINAL
  Filled 2024-07-09: qty 1

## 2024-07-09 MED ORDER — SOD CITRATE-CITRIC ACID 500-334 MG/5ML PO SOLN: 30.0000 mL | ORAL | Status: DC | PRN

## 2024-07-09 MED ORDER — ACETAMINOPHEN 325 MG PO TABS: 650.0000 mg | ORAL_TABLET | ORAL | Status: DC | PRN

## 2024-07-09 MED ORDER — MISOPROSTOL 200 MCG PO TABS
ORAL_TABLET | ORAL | Status: AC
  Filled 2024-07-09: qty 4

## 2024-07-09 MED ORDER — OXYTOCIN BOLUS FROM INFUSION: 333.0000 mL | Freq: Once | INTRAVENOUS | Status: DC

## 2024-07-09 MED ORDER — OXYTOCIN 10 UNIT/ML IJ SOLN
INTRAMUSCULAR | Status: AC
  Filled 2024-07-09: qty 2

## 2024-07-09 MED ORDER — LIDOCAINE HCL (PF) 1 % IJ SOLN: 30.0000 mL | INTRAMUSCULAR | Status: DC | PRN

## 2024-07-09 MED ORDER — FENTANYL CITRATE (PF) 100 MCG/2ML IJ SOLN: 50.0000 ug | INTRAMUSCULAR | Status: DC | PRN

## 2024-07-09 NOTE — H&P (Signed)
 OB History & Physical   History of Present Illness:  Chief Complaint: Here for induction of labor  HPI:  Cathy [name] is a 34 y.o. H3E8878 female at [redacted]w[redacted]d dated by 31 week ultrasound.  Her pregnancy has been complicated by history of intrauterine fetal demose in first pregnancy at about 36 weeks, history of postpartum preeclampsia with G2, late to prenatal care this pregnancy.    She denies contractions.   She denies leakage of fluid.   She denies vaginal bleeding.   She reports fetal movement.    Total weight gain for pregnancy: 36 lbs  Obstetrical Problem List: Pregnancy Problems (from 05/29/24 to present)     Problem Noted Diagnosed Resolved   Late prenatal care 05/29/2024 by Charma Domino, CNM  No   Bacterial vaginosis 05/29/2024 by Charma Domino, CNM  No   Anemia of pregnancy in third trimester 05/29/2024 by Charma Domino, CNM  No        Maternal Medical History:   Past Medical History:  Diagnosis Date   History of intrauterine fetal death, currently pregnant     Past Surgical History:  Procedure Laterality Date   TONSILLECTOMY      Allergies[1]  Prior to Admission medications  Medication Sig Start Date End Date Taking? Authorizing Provider  acetaminophen  (TYLENOL ) 325 MG tablet Take 2 tablets (650 mg total) by mouth every 4 (four) hours as needed (for pain scale < 4). 12/18/17  Yes Schuman, Christanna R, MD  aspirin EC 81 MG tablet Take 81 mg by mouth daily. Swallow whole.   Yes [provider]  ferrous sulfate 325 (65 FE) MG EC tablet Take 325 mg by mouth 3 (three) times daily with meals.   Yes [provider]  Prenatal Vit-Fe Fumarate-FA (MULTIVITAMIN-PRENATAL) 27-0.8 MG TABS tablet Take 1 tablet by mouth daily at 12 noon.   Yes [provider]  calcium  carbonate (TUMS - DOSED IN MG ELEMENTAL CALCIUM ) 500 MG chewable tablet Chew 2 tablets (400 mg of elemental calcium  total) by mouth every 4 (four) hours as needed for indigestion or heartburn.  05/29/24   Charma Domino, CNM    OB History  Gravida Para Term Preterm AB Living  6 2 1 1 2 1   SAB IAB Ectopic Multiple Live Births   2  0 1    # Outcome Date GA Lbr Len/2nd Weight Sex Type Anes PTL Lv  6 Current           5 Term 12/16/17 [redacted]w[redacted]d 12:19 / 02:03 3470 g F Vag-Spont EPI  LIV  4 Preterm 12/17/15 [redacted]w[redacted]d   F Vag-Spont   FD  3 IAB           2 IAB           1 Gravida             Prenatal care site: [facility]  Social History: She  reports that she has never smoked. She has never used smokeless tobacco. She reports that she does not drink alcohol and does not use drugs.  Family History: family history is not on file.   Review of Systems:  Review of Systems  Constitutional: Negative.   HENT: Negative.    Eyes: Negative.   Respiratory: Negative.    Cardiovascular: Negative.   Gastrointestinal: Negative.   Genitourinary: Negative.   Musculoskeletal: Negative.   Skin: Negative.   Neurological: Negative.   Psychiatric/Behavioral: Negative.       Physical Exam:  BP 134/79 (BP  Location: Left Arm)   Pulse 99   Temp 98.5 F (36.9 C) (Oral)   Resp 18   Ht 5' 5 (1.651 m)   Wt 78.5 kg   LMP 09/21/2023   BMI 28.80 kg/m   Physical Exam Constitutional:      General: She is not in acute distress.    Appearance: Normal appearance. She is well-developed.  HENT:     Head: Normocephalic and atraumatic.  Eyes:     General: No scleral icterus.    Conjunctiva/sclera: Conjunctivae normal.  Cardiovascular:     Rate and Rhythm: Normal rate and regular rhythm.     Heart sounds: No murmur heard.    No friction rub. No gallop.  Pulmonary:     Effort: Pulmonary effort is normal. No respiratory distress.     Breath sounds: Normal breath sounds. No wheezing or rales.  Abdominal:     General: Bowel sounds are normal. There is no distension.     Palpations: Abdomen is soft. There is mass (gravid, nt).     Tenderness: There is no abdominal tenderness. There is no  guarding or rebound.  Musculoskeletal:        General: Normal range of motion.     Cervical back: Normal range of motion and neck supple.  Neurological:     General: No focal deficit present.     Mental Status: She is alert and oriented to person, place, and time.     Cranial Nerves: No cranial nerve deficit.  Skin:    General: Skin is warm and dry.     Findings: No erythema.  Psychiatric:        Mood and Affect: Mood normal.        Behavior: Behavior normal.        Judgment: Judgment normal.      Pertinent Results:   Blood type/Rh O positive  Antibody screen negative  Rubella Not immune  Varicella Immune (2020)    RPR NR 05/29/2024  HBsAg negative  HIV Negative (05/29/2024)  GC negative  Chlamydia negative  Genetic screening Not done  1 hour GTT N/a (hemoglobin A1c 4.6 on 05/29/2024)  3 hour GTT N/a  GBS positive on 07/02/2024   Baseline FHR: 140 beats/min   Variability: moderate   Accelerations: present   Decelerations: absent Contractions: present frequency: irritability Overall assessment: cat 1  Ultrasound on 07/02/2024:  Number of Fetus: 1  Presentation: cephalic  Fluid: normal (AFI 16)  Placental Location: Posterior  Assessment:  Cathy [name] is a 34 y.o. H3E8878 female at [redacted]w[redacted]d with IOL for history of fetal demise with extreme maternal anxiety.   Plan:  Admit to Labor & Delivery  CBC, T&S, Clrs, IVF GBS positive.   Fetwal well-being: reassuring Start IOL via cytotec .    Garnette Mace, MD 07/09/2024 12:51 PM        [1] No Known Allergies

## 2024-07-10 LAB — SYPHILIS: RPR W/REFLEX TO RPR TITER AND TREPONEMAL ANTIBODIES, TRADITIONAL SCREENING AND DIAGNOSIS ALGORITHM: RPR Ser Ql: NONREACTIVE

## 2024-07-10 MED ORDER — LACTATED RINGERS IV SOLN: 500.0000 mL | INTRAVENOUS | Status: DC | PRN

## 2024-07-10 MED ORDER — MISOPROSTOL 25 MCG QUARTER TABLET
25.0000 ug | ORAL_TABLET | ORAL | Status: DC
  Administered 2024-07-10 (×2): 25 ug via ORAL
  Filled 2024-07-10 (×2): qty 1

## 2024-07-10 MED ORDER — LACTATED RINGERS IV SOLN: INTRAVENOUS | Status: DC

## 2024-07-10 NOTE — Progress Notes (Signed)
 Cathy [name] is a 34 y.o. [id number] at [redacted]w[redacted]d   Subjective: Feeling contractions  Objective: BP 115/71   Pulse 93   Temp 98.9 F (37.2 C) (Oral)   Resp 16   Ht 5' 5 (1.651 m)   Wt 78.5 kg   LMP 09/21/2023   BMI 28.80 kg/m  No intake/output data recorded. No intake/output data recorded.  FHT:  FHR: 140 bpm, variability: moderate,  accelerations:  Present,  decelerations:  Absent UC:   irregular, every 5 minutes SVE:   Dilation: 2 Effacement (%): 50 Station: -2 Exam by::  Pecola, MD)  Labs: Lab Results  Component Value Date   WBC 6.7 07/09/2024   HGB 10.2 (L) 07/09/2024   HCT 29.7 (L) 07/09/2024   MCV 85.6 07/09/2024   PLT 195 07/09/2024    Assessment / Plan: [place]uford cath attempted and failed, even with a speculum. Will start pitocin   Heather Penton, MD 07/10/2024, 7:32 PM

## 2024-07-11 ENCOUNTER — Other Ambulatory Visit: Payer: Self-pay | Admitting: Obstetrics and Gynecology

## 2024-07-11 DIAGNOSIS — O09293 Supervision of pregnancy with other poor reproductive or obstetric history, third trimester: Secondary | ICD-10-CM

## 2024-07-11 MED ORDER — CALCIUM CARBONATE ANTACID 500 MG PO CHEW
400.0000 mg | CHEWABLE_TABLET | Freq: Once | ORAL | Status: AC
  Administered 2024-07-11: 400 mg via ORAL
  Filled 2024-07-11: qty 2

## 2024-07-11 MED ORDER — PROPRANOLOL HCL 1 MG/ML IV SOLN
2.0000 mg | Freq: Once | INTRAVENOUS | Status: AC
  Administered 2024-07-11: 2 mg via INTRAVENOUS
  Filled 2024-07-11: qty 2

## 2024-07-11 NOTE — Discharge Summary (Signed)
 Physician Discharge Summary  Patient ID: Cathy [name] MRN: [id number] DOB/AGE: 09/02/1990 34 y.o.  Admit date: 07/09/2024 Admitting provider: Garnette JONETTA Mace, MD Discharge date: 07/11/2024   Admission Diagnoses:  1) HIstory of intrauterine fetal demise in previous pregnancy 2) elective induction of labor on 07/09/2024 3) intrauterine pregnancy at [redacted]w[redacted]d   Discharge Diagnoses:  1) HIstory of intrauterine fetal demise in previous pregnancy 2) elective induction of labor on 07/09/2024, failed induction of labor with no cervical change 3) intrauterine pregnancy at [redacted]w[redacted]d  History of Present Illness: Cathy [name] is a 34 y.o. [id number] female at [redacted]w[redacted]d dated by 31 week ultrasound.  Her pregnancy has been complicated by history of intrauterine fetal demose in first pregnancy at about 36 weeks, history of postpartum preeclampsia with G2, late to prenatal care this pregnancy.     She denies contractions.   She denies leakage of fluid.   She denies vaginal bleeding.   She reports fetal movement.     Total weight gain for pregnancy: 36 lbs  Past Medical History:  Diagnosis Date   History of intrauterine fetal death, currently pregnant     Past Surgical History:  Procedure Laterality Date   TONSILLECTOMY      Medications Ordered Prior to Encounter[1]  Allergies[2]  Social History   Socioeconomic History   Marital status: Single    Spouse name: Not on file   Number of children: Not on file   Years of education: Not on file   Highest education level: Not on file  Occupational History   Not on file  Tobacco Use   Smoking status: Never   Smokeless tobacco: Never  Substance and Sexual Activity   Alcohol use: No   Drug use: No   Sexual activity: Yes  Other Topics Concern   Not on file  Social History Narrative   Not on file   Social Drivers of Health   Tobacco Use: Low Risk (07/09/2024)   Patient History    Smoking Tobacco Use: Never    Smokeless Tobacco Use: Never     Passive Exposure: Not on file  Financial Resource Strain: Low Risk  (06/04/2024)   Received from [facility]   Overall Financial Resource Strain (CARDIA)    Difficulty of Paying Living Expenses: Not very hard  Food Insecurity: No Food Insecurity (07/09/2024)   Epic    Worried About Radiation Protection Practitioner of Food in the Last Year: Never true    Ran Out of Food in the Last Year: Never true  Transportation Needs: No Transportation Needs (07/09/2024)   Epic    Lack of Transportation (Medical): No    Lack of Transportation (Non-Medical): No  Physical Activity: Not on file  Stress: Not on file  Social Connections: Not on file  Intimate Partner Violence: Not At Risk (07/09/2024)   Epic    Fear of Current or Ex-Partner: No    Emotionally Abused: No    Physically Abused: No    Sexually Abused: No  Depression (PHQ2-9): Not on file  Alcohol Screen: Not on file  Housing: High Risk (07/09/2024)   Epic    Unable to Pay for Housing in the Last Year: Yes    Number of Times Moved in the Last Year: 0    Homeless in the Last Year: No  Utilities: Not At Risk (07/09/2024)   Epic    Threatened with loss of utilities: No  Health Literacy: Not on file    History reviewed. No pertinent family history.  Review of Systems  Constitutional: Negative.   HENT: Negative.    Eyes: Negative.   Respiratory: Negative.    Cardiovascular: Negative.   Gastrointestinal: Negative.   Genitourinary: Negative.   Musculoskeletal: Negative.   Skin: Negative.   Neurological: Negative.   Psychiatric/Behavioral: Negative.       Physical Exam: BP 110/68 (BP Location: Left Arm)   Pulse 77   Temp 97.9 F (36.6 C) (Oral)   Resp 16   Ht 5' 5 (1.651 m)   Wt 78.5 kg   LMP 09/21/2023   BMI 28.80 kg/m   Physical Exam Constitutional:      General: She is not in acute distress.    Appearance: Normal appearance. She is well-developed.  HENT:     Head: Normocephalic and atraumatic.  Eyes:     General: No  scleral icterus.    Conjunctiva/sclera: Conjunctivae normal.  Cardiovascular:     Rate and Rhythm: Normal rate and regular rhythm.     Heart sounds: No murmur heard.    No friction rub. No gallop.  Pulmonary:     Effort: Pulmonary effort is normal. No respiratory distress.     Breath sounds: Normal breath sounds. No wheezing or rales.  Abdominal:     General: Bowel sounds are normal. There is no distension.     Palpations: Abdomen is soft. There is mass (gravid, NT).     Tenderness: There is no abdominal tenderness. There is no guarding or rebound.  Musculoskeletal:        General: Normal range of motion.     Cervical back: Normal range of motion and neck supple.  Neurological:     General: No focal deficit present.     Mental Status: She is alert and oriented to person, place, and time.     Cranial Nerves: No cranial nerve deficit.  Skin:    General: Skin is warm and dry.     Findings: No erythema.  Psychiatric:        Mood and Affect: Mood normal.        Behavior: Behavior normal.        Judgment: Judgment normal.   Cervix: ~tight 2 cm  Consults: None  Significant Findings/ Diagnostic Studies:  None, see basic admission labs  Procedures:  1) Induction of labor attempt with misoprostol , failed Cook catheter insertion x 2 attempts, pitocin  escalation multiple times 2) Fetal monitoring with category 1 tracing the entire time, negative CST with closely spaced contractions at times and the fetal tracing had no decelerations and did have moderate variability.  Hospital Course: The patient was admitted to Labor and Delivery for induction of labor on 07/09/2024. Her hospital course was significant for multiple efforts at any cervical change (see above).  She had NO cervical change. She had normal vital signs the entire time (no elevated BPs), afebrile. Very reassuring fetal tracing the entire time.  This morning she was given the option to stay and we continue trying, with the added  risk that we might need to consider c-section if no significant change.  The alternative would be to return to L&D in 2 days and try again with higher likelihood of success given that she has had two vaginal births at around the same time in the pregnancy. After she considered the options, she elected to return. She is scheduled to return to the hospital on 1/23 at 0500 for another try at IOL.  Discharge Condition: stable  Disposition: Discharge disposition: 01-Home or Self  Care       Diet: Regular diet  Discharge Activity: Activity as tolerated   Allergies as of 07/11/2024   No Known Allergies      Medication List     TAKE these medications    acetaminophen  325 MG tablet Commonly known as: Tylenol  Take 2 tablets (650 mg total) by mouth every 4 (four) hours as needed (for pain scale < 4).   aspirin EC 81 MG tablet Take 81 mg by mouth daily. Swallow whole.   calcium  carbonate 500 MG chewable tablet Commonly known as: TUMS - dosed in mg elemental calcium  Chew 2 tablets (400 mg of elemental calcium  total) by mouth every 4 (four) hours as needed for indigestion or heartburn.   ferrous sulfate 325 (65 FE) MG EC tablet Take 325 mg by mouth 3 (three) times daily with meals.   multivitamin-prenatal 27-0.8 MG Tabs tablet Take 1 tablet by mouth daily at 12 noon.        Follow-up Information     [facility]. Go on 07/13/2024.   Specialty: Obstetrics and Gynecology Why: For scheduled induction of labor Contact information: [address] [place]  [address] [phone]                Total time spent taking care of this patient: 45 minutes  Signed: Garnette Mace, MD  07/11/2024, 11:22 AM     [1]  No current facility-administered medications on file prior to encounter.   Current Outpatient Medications on File Prior to Encounter  Medication Sig Dispense Refill   acetaminophen  (TYLENOL ) 325 MG  tablet Take 2 tablets (650 mg total) by mouth every 4 (four) hours as needed (for pain scale < 4). 30 tablet 0   aspirin EC 81 MG tablet Take 81 mg by mouth daily. Swallow whole.     ferrous sulfate 325 (65 FE) MG EC tablet Take 325 mg by mouth 3 (three) times daily with meals.     Prenatal Vit-Fe Fumarate-FA (MULTIVITAMIN-PRENATAL) 27-0.8 MG TABS tablet Take 1 tablet by mouth daily at 12 noon.     calcium  carbonate (TUMS - DOSED IN MG ELEMENTAL CALCIUM ) 500 MG chewable tablet Chew 2 tablets (400 mg of elemental calcium  total) by mouth every 4 (four) hours as needed for indigestion or heartburn.    [2] No Known Allergies

## 2024-07-11 NOTE — OB Triage Note (Signed)
 Patient was evaluated by Dr Leonce and her plan of care was to be discharged and come back on Friday morning for another induction if labor does not set in before that day. Patient consented and verbalize understanding .  Education on signs of labor and abnormal things to look out for at home.

## 2024-07-13 ENCOUNTER — Inpatient Hospital Stay
Admission: RE | Admit: 2024-07-13 | Discharge: 2024-07-15 | DRG: 807 | Disposition: A | Attending: Obstetrics and Gynecology | Admitting: Obstetrics and Gynecology

## 2024-07-13 ENCOUNTER — Inpatient Hospital Stay

## 2024-07-13 ENCOUNTER — Inpatient Hospital Stay: Admitting: Anesthesiology

## 2024-07-13 ENCOUNTER — Other Ambulatory Visit: Payer: Self-pay

## 2024-07-13 ENCOUNTER — Encounter: Payer: Self-pay | Admitting: Obstetrics and Gynecology

## 2024-07-13 DIAGNOSIS — Z3A37 37 weeks gestation of pregnancy: Secondary | ICD-10-CM

## 2024-07-13 DIAGNOSIS — Z8759 Personal history of other complications of pregnancy, childbirth and the puerperium: Principal | ICD-10-CM

## 2024-07-13 DIAGNOSIS — O9902 Anemia complicating childbirth: Secondary | ICD-10-CM | POA: Diagnosis present

## 2024-07-13 DIAGNOSIS — Z7982 Long term (current) use of aspirin: Secondary | ICD-10-CM | POA: Diagnosis not present

## 2024-07-13 DIAGNOSIS — O99824 Streptococcus B carrier state complicating childbirth: Secondary | ICD-10-CM | POA: Diagnosis present

## 2024-07-13 DIAGNOSIS — O09293 Supervision of pregnancy with other poor reproductive or obstetric history, third trimester: Secondary | ICD-10-CM

## 2024-07-13 DIAGNOSIS — O26893 Other specified pregnancy related conditions, third trimester: Secondary | ICD-10-CM | POA: Diagnosis present

## 2024-07-13 LAB — CBC
HCT: 31.7 % — ABNORMAL LOW (ref 36.0–46.0)
Hemoglobin: 10.5 g/dL — ABNORMAL LOW (ref 12.0–15.0)
MCH: 29.2 pg (ref 26.0–34.0)
MCHC: 33.1 g/dL (ref 30.0–36.0)
MCV: 88.3 fL (ref 80.0–100.0)
Platelets: 205 K/uL (ref 150–400)
RBC: 3.59 MIL/uL — ABNORMAL LOW (ref 3.87–5.11)
RDW: 13.7 % (ref 11.5–15.5)
WBC: 6.9 K/uL (ref 4.0–10.5)
nRBC: 0 % (ref 0.0–0.2)

## 2024-07-13 LAB — TYPE AND SCREEN
ABO/RH(D): O POS
Antibody Screen: NEGATIVE

## 2024-07-13 MED ORDER — LACTATED RINGERS IV SOLN: 500.0000 mL | Freq: Once | INTRAVENOUS | Status: DC

## 2024-07-13 MED ORDER — LIDOCAINE HCL (PF) 1 % IJ SOLN: 30.0000 mL | INTRAMUSCULAR | Status: DC | PRN

## 2024-07-13 MED ORDER — SOD CITRATE-CITRIC ACID 500-334 MG/5ML PO SOLN
30.0000 mL | ORAL | Status: DC | PRN
  Administered 2024-07-14: 30 mL via ORAL
  Filled 2024-07-13: qty 30

## 2024-07-13 MED ORDER — LIDOCAINE HCL (PF) 1 % IJ SOLN
INTRAMUSCULAR | Status: DC | PRN
  Administered 2024-07-13: 3 mL

## 2024-07-13 MED ORDER — TERBUTALINE SULFATE 1 MG/ML IJ SOLN: 0.2500 mg | Freq: Once | INTRAMUSCULAR | Status: DC | PRN

## 2024-07-13 MED ORDER — LACTATED RINGERS IV SOLN: INTRAVENOUS | Status: DC

## 2024-07-13 MED ORDER — PHENYLEPHRINE 80 MCG/ML (10ML) SYRINGE FOR IV PUSH (FOR BLOOD PRESSURE SUPPORT): 80.0000 ug | PREFILLED_SYRINGE | INTRAVENOUS | Status: DC | PRN

## 2024-07-13 MED ORDER — EPHEDRINE 5 MG/ML INJ: 10.0000 mg | INTRAVENOUS | Status: DC | PRN

## 2024-07-13 MED ORDER — DIPHENHYDRAMINE HCL 50 MG/ML IJ SOLN: 12.5000 mg | INTRAMUSCULAR | Status: DC | PRN

## 2024-07-13 MED ORDER — BUPIVACAINE HCL (PF) 0.25 % IJ SOLN
INTRAMUSCULAR | Status: DC | PRN
  Administered 2024-07-13 (×2): 5 mL via EPIDURAL

## 2024-07-13 MED ORDER — OXYTOCIN BOLUS FROM INFUSION
333.0000 mL | Freq: Once | INTRAVENOUS | Status: AC
  Administered 2024-07-14: 333 mL via INTRAVENOUS

## 2024-07-13 MED ORDER — OXYTOCIN-SODIUM CHLORIDE 30-0.9 UT/500ML-% IV SOLN
2.5000 [IU]/h | INTRAVENOUS | Status: DC
  Administered 2024-07-14: 2.5 [IU]/h via INTRAVENOUS
  Filled 2024-07-13 (×2): qty 500

## 2024-07-13 MED ORDER — EPHEDRINE 5 MG/ML INJ
10.0000 mg | INTRAVENOUS | Status: DC | PRN
  Filled 2024-07-13: qty 5

## 2024-07-13 MED ORDER — LACTATED RINGERS IV SOLN
500.0000 mL | INTRAVENOUS | Status: DC | PRN
  Administered 2024-07-13: 500 mL via INTRAVENOUS

## 2024-07-13 MED ORDER — MISOPROSTOL 50MCG HALF TABLET
50.0000 ug | ORAL_TABLET | Freq: Once | ORAL | Status: AC
  Administered 2024-07-13: 50 ug via VAGINAL
  Filled 2024-07-13: qty 1

## 2024-07-13 MED ORDER — MISOPROSTOL 50MCG HALF TABLET
50.0000 ug | ORAL_TABLET | Freq: Once | ORAL | Status: AC
  Administered 2024-07-13: 50 ug via ORAL
  Filled 2024-07-13: qty 1

## 2024-07-13 MED ORDER — FENTANYL-BUPIVACAINE-NACL 0.5-0.125-0.9 MG/250ML-% EP SOLN
12.0000 mL/h | EPIDURAL | Status: DC | PRN
  Administered 2024-07-13: 12 mL/h via EPIDURAL
  Filled 2024-07-13: qty 250

## 2024-07-13 MED ORDER — PENICILLIN G POT IN DEXTROSE 60000 UNIT/ML IV SOLN
3.0000 10*6.[IU] | INTRAVENOUS | Status: DC
  Administered 2024-07-13 – 2024-07-14 (×6): 3 10*6.[IU] via INTRAVENOUS
  Filled 2024-07-13 (×6): qty 50

## 2024-07-13 MED ORDER — OXYTOCIN 10 UNIT/ML IJ SOLN: 10.0000 [IU] | Freq: Once | INTRAMUSCULAR | Status: DC

## 2024-07-13 MED ORDER — LIDOCAINE-EPINEPHRINE (PF) 1.5 %-1:200000 IJ SOLN
INTRAMUSCULAR | Status: DC | PRN
  Administered 2024-07-13: 3 mL via PERINEURAL

## 2024-07-13 MED ORDER — OXYTOCIN-SODIUM CHLORIDE 30-0.9 UT/500ML-% IV SOLN
1.0000 m[IU]/min | INTRAVENOUS | Status: DC
  Administered 2024-07-13 – 2024-07-14 (×3): 2 m[IU]/min via INTRAVENOUS

## 2024-07-13 MED ORDER — SODIUM CHLORIDE 0.9 % IV SOLN
5.0000 10*6.[IU] | Freq: Once | INTRAVENOUS | Status: AC
  Administered 2024-07-13: 5 10*6.[IU] via INTRAVENOUS
  Filled 2024-07-13: qty 5

## 2024-07-13 MED ORDER — ONDANSETRON HCL 4 MG/2ML IJ SOLN: 4.0000 mg | Freq: Four times a day (QID) | INTRAMUSCULAR | Status: DC | PRN

## 2024-07-13 NOTE — Anesthesia Procedure Notes (Signed)
 Epidural Patient location during procedure: OB Start time: 07/13/2024 8:46 PM End time: 07/13/2024 8:47 PM  Staffing Anesthesiologist: Chesley Lendia CROME, MD Performed: anesthesiologist   Preanesthetic Checklist Completed: patient identified, IV checked, site marked, risks and benefits discussed, surgical consent, monitors and equipment checked, pre-op evaluation and timeout performed  Epidural Patient position: sitting Prep: ChloraPrep Patient monitoring: heart rate, continuous pulse ox and blood pressure Approach: midline Location: L3-L4 Injection technique: LOR saline  Needle:  Needle insertion depth (cm): 6.5 Needle type: Tuohy  Needle gauge: 17 G Needle length: 9 cm and 9 Needle insertion depth: 6.5 cm Catheter type: closed end flexible Catheter size: 19 Gauge Catheter at skin depth: 12 cm Test dose: negative and 1.5% lidocaine  with Epi 1:200 K  Assessment Sensory level: T10 Events: blood not aspirated, injection not painful, no injection resistance, no paresthesia and negative IV test  Additional Notes 1 attempt Pt. Evaluated and documentation done after procedure finished. Patient identified. Risks/Benefits/Options discussed with patient including but not limited to bleeding, infection, nerve damage, paralysis, failed block, incomplete pain control, headache, blood pressure changes, nausea, vomiting, reactions to medication both or allergic, itching and postpartum back pain. Confirmed with bedside nurse the patient's most recent platelet count. Confirmed with patient that they are not currently taking any anticoagulation, have any bleeding history or any family history of bleeding disorders. Patient expressed understanding and wished to proceed. All questions were answered. Sterile technique was used throughout the entire procedure. Please see nursing notes for vital signs. Test dose was given through epidural catheter and negative prior to continuing to dose epidural or start  infusion. Warning signs of high block given to the patient including shortness of breath, tingling/numbness in hands, complete motor block, or any concerning symptoms with instructions to call for help. Patient was given instructions on fall risk and not to get out of bed. All questions and concerns addressed with instructions to call with any issues or inadequate analgesia.    Patient tolerated the insertion well without immediate complications. Reason for block:procedure for pain

## 2024-07-13 NOTE — H&P (Signed)
 OB History & Physical   History of Present Illness:  Chief Complaint:   HPI:  Cathy [name] is a 34 y.o. H3E8878 female at [redacted]w[redacted]d dated by u/s.  She presents to L&D for IOL for h/o IUFD  She reports:  -active fetal movement -no leakage of fluid -no vaginal bleeding -no contractions  Pregnancy Issues: History Of Intrauterine Fetal Demise (>24 weeks) G1 Pregnancy H/o post partum Pre E with G2 pregnancy Late to care NEW OB at 32w Rubella Non Immune GBS pos    Maternal Medical History:   Past Medical History:  Diagnosis Date   History of intrauterine fetal death, currently pregnant     Past Surgical History:  Procedure Laterality Date   TONSILLECTOMY      Allergies[1]  Prior to Admission medications  Medication Sig Start Date End Date Taking? Authorizing Provider  aspirin EC 81 MG tablet Take 81 mg by mouth daily. Swallow whole.   Yes [provider]  ferrous sulfate  325 (65 FE) MG EC tablet Take 325 mg by mouth 3 (three) times daily with meals.   Yes [provider]  acetaminophen  (TYLENOL ) 325 MG tablet Take 2 tablets (650 mg total) by mouth every 4 (four) hours as needed (for pain scale < 4). 12/18/17   Schuman, Claudell SAUNDERS, MD  calcium  carbonate (TUMS - DOSED IN MG ELEMENTAL CALCIUM ) 500 MG chewable tablet Chew 2 tablets (400 mg of elemental calcium  total) by mouth every 4 (four) hours as needed for indigestion or heartburn. 05/29/24   Charma Domino, CNM  Prenatal Vit-Fe Fumarate-FA (MULTIVITAMIN-PRENATAL) 27-0.8 MG TABS tablet Take 1 tablet by mouth daily at 12 noon.    [provider]     Prenatal care site: [facility]  Social History: She  reports that she has never smoked. She has never used smokeless tobacco. She reports that she does not drink alcohol and does not use drugs.  Family History: family history is not on file.   Review of Systems: A full review of systems was performed and negative except as noted in the  HPI.    Physical Exam:  Vital Signs: BP 116/66 (BP Location: Left Arm)   Pulse (!) 101   Temp 97.9 F (36.6 C) (Oral)   Resp 16   Ht 5' 5 (1.651 m)   Wt 78.5 kg   LMP 09/21/2023   BMI 28.80 kg/m   General:   alert and cooperative  Skin:  normal  Neurologic:    Alert & oriented x 3  Lungs:   Nl effort  Heart:   regular rate and rhythm  Abdomen:  normal findings: soft, non-tender  Extremities: : non-tender, symmetric, no edema bilaterally.       Results for orders placed or performed during the hospital encounter of 07/13/24 (from the past 24 hours)  CBC     Status: Abnormal   Collection Time: 07/13/24 10:01 AM  Result Value Ref Range   WBC 6.9 4.0 - 10.5 K/uL   RBC 3.59 (L) 3.87 - 5.11 MIL/uL   Hemoglobin 10.5 (L) 12.0 - 15.0 g/dL   HCT 68.2 (L) 63.9 - 53.9 %   MCV 88.3 80.0 - 100.0 fL   MCH 29.2 26.0 - 34.0 pg   MCHC 33.1 30.0 - 36.0 g/dL   RDW 86.2 88.4 - 84.4 %   Platelets 205 150 - 400 K/uL   nRBC 0.0 0.0 - 0.2 %  Type and screen     Status: None  Collection Time: 07/13/24 10:01 AM  Result Value Ref Range   ABO/RH(D) O POS    Antibody Screen NEG    Sample Expiration      07/16/2024,2359 Performed at [facility], [address] [place], [place] [address]     Pertinent Results:  Prenatal Labs: Blood type/Rh O pos  Antibody screen neg  Rubella Non-Immune  Varicella Immune  RPR NR  HBsAg Neg  HIV NR  GC neg  Chlamydia neg  Genetic screening negative  1 hour GTT 117  3 hour GTT   GBS Pos   FHT: FHR: 140 bpm, variability: moderate,  accelerations:  Present,  decelerations:  Absent Category/reactivity:  Category I TOCO: none SVE: Dilation: 2 / Effacement (%): 50 / Station: -2    Cephalic by leopolds  US  OB Limited Result Date: 07/13/2024 EXAM: ULTRASOUND OB LIMITED TECHNIQUE: Transabdominal obstetric pelvic ultrasound was performed with color Doppler flow evaluation. COMPARISON: US  Pregnancy 05/29/2024. CLINICAL HISTORY: Encounter for  induction of labor. FINDINGS: FETUS: A single live intrauterine pregnancy is present. Estimated gestational age by BPD measurement is 37 weeks and 5 days. Estimated gestational age by the first ultrasound is 37 weeks and 6 days. POSITION: The fetus position is Cephalic. HEART RATE: Fetal heart rate measures 160 beats per minute. PLACENTA: The placenta is located Posterior. AMNIOTIC FLUID: Amniotic fluid index is subjectively within normal limits. BIOMETRICS: BPD measures 9.3 cm. IMPRESSION: 1. Single live intrauterine gestation in cephalic presentation with an estimated gestational age of [redacted] weeks and 5 days by current sonographic biometry. Fetal heart rate of 160 beats per minute. Electronically signed by: Rogelia Myers MD 07/13/2024 12:55 PM EST RP Workstation: [id number]     Assessment:  Cathy [name] is a 34 y.o. H3E8878 female at [redacted]w[redacted]d with h/o IUFD.   Plan:  1. Admit to Labor & Delivery; consents reviewed and obtained  2. Fetal Well being  - Fetal Tracing: Cat I - GBS pos - Presentation: VTX confirmed by u/s   3. Routine OB: - Prenatal labs reviewed, as above - Rh pos - CBC & T&S on admit - Clear fluids, IVF  4. Induction of Labor -  Contractions by external toco in place -  Pelvis proven to 3470g -  Plan for induction with cytotec  -  Plan for continuous fetal monitoring  -  Maternal pain control as desired: IVPM, nitrous, regional anesthesia - Anticipate vaginal delivery  5. Post Partum Planning: - Infant feeding: TBD - Contraception: TBD - Needs MMR vaccine  - Tdap: given 07/02/24  - Flu: declined 06/04/24  - RSV: declined 06/20/24   Tenna Lacko, CNM 07/13/2024 2:45 PM     [1] No Known Allergies

## 2024-07-13 NOTE — Anesthesia Preprocedure Evaluation (Signed)
"                                    Anesthesia Evaluation  Patient identified by MRN, date of birth, ID band Patient awake    Reviewed: Allergy & Precautions, NPO status , Patient's Chart, lab work & pertinent test results  History of Anesthesia Complications Negative for: history of anesthetic complications  Airway Mallampati: III  TM Distance: >3 FB Neck ROM: full    Dental no notable dental hx.    Pulmonary neg pulmonary ROS   Pulmonary exam normal        Cardiovascular Exercise Tolerance: Good negative cardio ROS Normal cardiovascular exam     Neuro/Psych negative neurological ROS     GI/Hepatic negative GI ROS, Neg liver ROS,,,  Endo/Other  negative endocrine ROS    Renal/GU negative Renal ROS  negative genitourinary   Musculoskeletal   Abdominal   Peds  Hematology  (+) Blood dyscrasia, anemia   Anesthesia Other Findings Past Medical History: No date: History of intrauterine fetal death, currently pregnant  Past Surgical History: No date: TONSILLECTOMY  BMI    Body Mass Index: 28.80 kg/m      Reproductive/Obstetrics (+) Pregnancy                              Anesthesia Physical Anesthesia Plan  ASA: 2  Anesthesia Plan: Epidural   Post-op Pain Management:    Induction:   PONV Risk Score and Plan:   Airway Management Planned: Natural Airway  Additional Equipment:   Intra-op Plan:   Post-operative Plan:   Informed Consent: I have reviewed the patients History and Physical, chart, labs and discussed the procedure including the risks, benefits and alternatives for the proposed anesthesia with the patient or authorized representative who has indicated his/her understanding and acceptance.     Dental Advisory Given  Plan Discussed with: Anesthesiologist  Anesthesia Plan Comments: (Patient reports no bleeding problems and no anticoagulant use.   Patient consented for risks of anesthesia  including but not limited to:  - adverse reactions to medications - risk of bleeding, infection and or nerve damage from epidural that could lead to paralysis - risk of headache or failed epidural - nerve damage due to positioning - that if epidural is used for C-section that there is a chance of epidural failure requiring spinal placement or conversion to GA - Damage to heart, brain, lungs, other parts of body or loss of life  Patient voiced understanding and assent.)        Anesthesia Quick Evaluation  "

## 2024-07-13 NOTE — Progress Notes (Signed)
 Patient ID: Cathy [name], female   DOB: [birth year], 34 y.o.   MRN: [id number] Repeat IOL for h/o IUGR .   Cx : tft / 25% -3 - AROM scant clear fluid . Start pitocin   Cont abx for GBS prophylaxis

## 2024-07-13 NOTE — Progress Notes (Signed)
 Labor Progress Note   ASSESSMENT/PLAN   Cathy [name] 34 y.o.   [id number]  at [redacted]w[redacted]d admited for IOL for hx IUFD at 36 wks.  FWB:  - Fetal well being assessed: Category 1        GBS: - GBS: positive - Prophylaxis: Adequate. Continue PCN q 4h  LABOR: - Now in early labor, doing well. - Pain Management: comfortable s/p epidural placement - Contracting frequently without pitocin . If contractions space, will restart. - Anticipate SVD   Principal Problem:   History of prior pregnancy with IUGR newborn     SUBJECTIVE/OBJECTIVE   SUBJECTIVE:  Comfortable following epidural placement.  OBJECTIVE: Vital Signs: Patient Vitals for the past 12 hrs:  BP Temp Temp src Pulse Resp SpO2 Height Weight  07/13/24 2155 -- -- -- -- -- 98 % -- --  07/13/24 2154 120/72 -- -- (!) 124 -- -- -- --  07/13/24 2140 124/72 -- -- 100 -- 99 % -- --  07/13/24 2125 -- -- -- -- -- 99 % -- --  07/13/24 2123 (!) 131/100 -- -- (!) 110 -- -- -- --  07/13/24 2121 111/67 -- -- 94 -- -- -- --  07/13/24 2120 -- -- -- -- -- 98 % -- --  07/13/24 2108 136/76 -- -- (!) 120 -- 100 % -- --  07/13/24 2105 -- -- -- -- -- 100 % -- --  07/13/24 2103 129/74 -- -- 96 -- -- -- --  07/13/24 2059 122/89 -- -- 100 -- 100 % -- --  07/13/24 2057 -- -- -- -- -- 100 % -- --  07/13/24 2056 134/85 98.4 F (36.9 C) Oral 91 -- -- -- --  07/13/24 1910 131/80 98.6 F (37 C) Oral (!) 105 16 -- -- --  07/13/24 1659 113/63 98 F (36.7 C) Axillary 99 17 -- -- --  07/13/24 1325 116/66 97.9 F (36.6 C) Oral (!) 101 16 -- -- --  07/13/24 1200 -- -- -- -- -- -- 5' 5 (1.651 m) 78.5 kg    Last SVE:  Dilation: 3.5 (07/13/24 2153) Effacement (%): 70 (07/13/24 2153) Cervical Position: Posterior (07/13/24 1452) Station: -2 (07/13/24 2153) Presentation: Vertex (07/13/24 2153) Exam by:: Charma, CNM (07/13/24 2153)  Membranes Sac Identifier: Sac 1 Membrane Status: AROM Rupture Date: 07/13/24 Rupture Time: 1452 Color:  White Odor: Normal Amount: Small  FHR:   - Mode: External  - Baseline Rate (A): 145 bpm (FHT)  - Variability: Moderate  - Characteristics (ie - accels, decels): Accelerations: present, Decelerations: None  UTERINE ACTIVITY:   - Mode: Toco  - Contraction Frequency (min): 1-4 minutes  Pitocin : off

## 2024-07-13 NOTE — Progress Notes (Signed)
 Labor Progress Note   ASSESSMENT/PLAN   Cathy [name] 34 y.o.   H3E8878  at [redacted]w[redacted]d admited for IOL for history of IUFD at 36+ weeks.  FWB:  - Fetal well being assessed: Category 1        GBS: - GBS: positive - Prophylaxis: No penicillin  allergy, treat with penicillin . Has received 2 doses.  LABOR: - Now in early labor, doing well. - Pain Management: planning epidural - Discussed labor with patient and will continue IV Pitocin  induction - Anticipate SVD   Principal Problem:   History of prior pregnancy with IUGR newborn     SUBJECTIVE/OBJECTIVE   SUBJECTIVE:  Assumed care of patient at 1700. In to introduce myself. She feels contractions/ cramping has intensified since AROM at 1452. Pitocin  was started at 1700, which was 4 hours after she received her first and only dose of misoprostol . Her mother is at bedside and supportive. Cathy [name] thinks she'll be ready for an epidural soon.   OBJECTIVE: Vital Signs: Patient Vitals for the past 12 hrs:  BP Temp Temp src Pulse Resp Height Weight  07/13/24 1659 113/63 98 F (36.7 C) Axillary 99 17 -- --  07/13/24 1325 116/66 97.9 F (36.6 C) Oral (!) 101 16 -- --  07/13/24 1200 -- -- -- -- -- 5' 5 (1.651 m) 78.5 kg    Last SVE:  Dilation: 1 (07/13/24 1452) Effacement (%): 30 (07/13/24 1452) Cervical Position: Posterior (07/13/24 1452) Station: -2 (07/13/24 1452) Presentation: Vertex (07/13/24 1452) Exam by:: Schermerhorn MD (07/13/24 1452)  Membranes Sac Identifier: Sac 1 Membrane Status: AROM Rupture Date: 07/13/24 Rupture Time: 1452 Color: White Odor: Normal Amount: Small  FHR:   - Mode: External  - Baseline Rate (A): 145 bpm  - Variability: Moderate  - Characteristics (ie - accels, decels): Accelerations: 15 x 15, Decelerations: None  UTERINE ACTIVITY:   - Mode: Toco  - Contraction Frequency (min): 1-2 minutes  Pitocin : 2mu

## 2024-07-14 ENCOUNTER — Encounter: Payer: Self-pay | Admitting: Obstetrics and Gynecology

## 2024-07-14 LAB — SYPHILIS: RPR W/REFLEX TO RPR TITER AND TREPONEMAL ANTIBODIES, TRADITIONAL SCREENING AND DIAGNOSIS ALGORITHM: RPR Ser Ql: NONREACTIVE

## 2024-07-14 MED ORDER — WITCH HAZEL-GLYCERIN EX PADS
1.0000 | MEDICATED_PAD | CUTANEOUS | Status: DC | PRN
  Administered 2024-07-14: 1 via TOPICAL
  Filled 2024-07-14: qty 100

## 2024-07-14 MED ORDER — BENZOCAINE-MENTHOL 20-0.5 % EX AERO
1.0000 | INHALATION_SPRAY | CUTANEOUS | Status: DC | PRN
  Administered 2024-07-14: 1 via TOPICAL
  Filled 2024-07-14: qty 56

## 2024-07-14 MED ORDER — IBUPROFEN 600 MG PO TABS
600.0000 mg | ORAL_TABLET | Freq: Four times a day (QID) | ORAL | Status: DC
  Administered 2024-07-14 – 2024-07-15 (×4): 600 mg via ORAL
  Filled 2024-07-14 (×4): qty 1

## 2024-07-14 MED ORDER — ONDANSETRON HCL 4 MG/2ML IJ SOLN: 4.0000 mg | INTRAMUSCULAR | Status: DC | PRN

## 2024-07-14 MED ORDER — PRENATAL MULTIVITAMIN CH
1.0000 | ORAL_TABLET | Freq: Every day | ORAL | Status: DC
  Administered 2024-07-15: 1 via ORAL
  Filled 2024-07-14 (×2): qty 1

## 2024-07-14 MED ORDER — DIBUCAINE (PERIANAL) 1 % EX OINT
1.0000 | TOPICAL_OINTMENT | CUTANEOUS | Status: DC | PRN
  Filled 2024-07-14: qty 28

## 2024-07-14 MED ORDER — ONDANSETRON HCL 4 MG PO TABS: 4.0000 mg | ORAL_TABLET | ORAL | Status: DC | PRN

## 2024-07-14 MED ORDER — COCONUT OIL OIL: 1.0000 | TOPICAL_OIL | Status: DC | PRN

## 2024-07-14 MED ORDER — OXYCODONE HCL 5 MG PO TABS: 10.0000 mg | ORAL_TABLET | ORAL | Status: DC | PRN

## 2024-07-14 MED ORDER — FERROUS SULFATE 325 (65 FE) MG PO TABS
325.0000 mg | ORAL_TABLET | Freq: Two times a day (BID) | ORAL | Status: DC
  Administered 2024-07-14 – 2024-07-15 (×2): 325 mg via ORAL
  Filled 2024-07-14 (×2): qty 1

## 2024-07-14 MED ORDER — DIPHENHYDRAMINE HCL 25 MG PO CAPS: 25.0000 mg | ORAL_CAPSULE | Freq: Four times a day (QID) | ORAL | Status: DC | PRN

## 2024-07-14 MED ORDER — SENNOSIDES-DOCUSATE SODIUM 8.6-50 MG PO TABS
2.0000 | ORAL_TABLET | Freq: Every day | ORAL | Status: DC
  Administered 2024-07-15: 2 via ORAL
  Filled 2024-07-14: qty 2

## 2024-07-14 MED ORDER — SIMETHICONE 80 MG PO CHEW: 80.0000 mg | CHEWABLE_TABLET | ORAL | Status: DC | PRN

## 2024-07-14 MED ORDER — OXYCODONE HCL 5 MG PO TABS: 5.0000 mg | ORAL_TABLET | ORAL | Status: DC | PRN

## 2024-07-14 MED ORDER — TETANUS-DIPHTH-ACELL PERTUSSIS 5-2-15.5 LF-MCG/0.5 IM SUSP
0.5000 mL | Freq: Once | INTRAMUSCULAR | Status: DC
  Filled 2024-07-14: qty 0.5

## 2024-07-14 MED ORDER — ACETAMINOPHEN 325 MG PO TABS
650.0000 mg | ORAL_TABLET | ORAL | Status: DC | PRN
  Administered 2024-07-14: 650 mg via ORAL
  Filled 2024-07-14: qty 2

## 2024-07-14 NOTE — Progress Notes (Signed)
 Labor Progress Note  Cathy [name] is a 34 y.o. H3E8878 at [redacted]w[redacted]d by ultrasound admitted for induction of labor due to h/o IUFD.  Subjective: Pt is comfortable with her epidural but is noting some rectal pressure  Objective: BP 125/81   Pulse (!) 119   Temp 98.6 F (37 C) (Oral)   Resp 18   Ht 5' 5 (1.651 m)   Wt 78.5 kg   LMP 09/21/2023   SpO2 98%   BMI 28.80 kg/m   Fetal Assessment: FHT:  FHR: 135 bpm, variability: moderate,  accelerations:  Present,  decelerations:  Absent Category/reactivity:  Category I UC:   regular, every 2-3 minutes SVE:    Dilation: 7cm  Effacement: ---  Station:  +1  Consistency: ---  Position: ---  Membrane status: AROM at 1452 Amniotic color: White  Labs: Lab Results  Component Value Date   WBC 6.9 07/13/2024   HGB 10.5 (L) 07/13/2024   HCT 31.7 (L) 07/13/2024   MCV 88.3 07/13/2024   PLT 205 07/13/2024    Assessment / Plan: Induction of labor due to h/o IUFD 1018 2/50/-2 1309 Cytotec  50mcg PO and PV 1321 GBS abx started  (dose 1) 1452 AROM  1/30/-2 1504 Pitocin  started  1652 GBS abx  (dose 2) 1905 Pitocin  stopped 2045 Epidural placed 2110 GBS abx  (dose 3) 2153 3.5/70/-2 0048 Pitocin  restarted 0053 GBS abx  (dose 4) 0315 3.5/  /-2 0422 IUPC   4/90/-2  0455 GBS abx  (dose 5) 0640 6/90/0 Pitocin  currently at 12mU  Labor: Progressing normally Preeclampsia:  107/64 Fetal Wellbeing:  Category I Pain Control:  Epidural I/D:  Afebrile, GBS pos, AROM x 17hrs Anticipated MOD:  NSVD  Margery FORBES Coe, CNM 07/14/2024, 1:54 PM

## 2024-07-14 NOTE — Discharge Summary (Signed)
 Postpartum Discharge Summary  Patient Name: Cathy [name] DOB: Jun 13, 1991 MRN: [id number]  Date of admission: 07/13/2024 Delivery date:07/14/2024 Delivering provider: MYRON NEST Date of discharge: 07/15/2024  Primary OB: [facility] OFE:Ejupzwu'd last menstrual period was 09/21/2023. EDC Estimated Date of Delivery: 07/28/24 Gestational Age at Delivery: [redacted]w[redacted]d   Admitting diagnosis: History of prior pregnancy with IUGR newborn [Z87.59] Intrauterine pregnancy: [redacted]w[redacted]d     Secondary diagnosis:   Principal Problem:   NSVD (normal spontaneous vaginal delivery) Active Problems:   History of prior pregnancy with IUGR newborn   Discharge Diagnosis: Term Pregnancy Delivered      Hospital course: Induction of Labor With Vaginal Delivery   34 y.o. yo [id number] at [redacted]w[redacted]d was admitted to the hospital 07/13/2024 for induction of labor.  Indication for induction: h/o IUFD.  Patient had an labor course complicated by none Membrane Rupture Time/Date: 2:52 PM,07/13/2024  Delivery Method:Vaginal, Spontaneous Operative Delivery:N/A Episiotomy: None Lacerations:  1st degree;Vaginal Details of delivery can be found in separate delivery note.  Patient had a postpartum course complicated by none. Patient is discharged home 07/15/24.  Newborn Data: Birth date:07/14/2024 Birth time:3:15 PM Gender:Female Living status:Living Apgars:8 ,9  Weight:3240 g                                            Post partum procedures:none Induction:: AROM, Pitocin , and Cytotec  Complications: None Delivery Type: spontaneous vaginal delivery Anesthesia: epidural anesthesia Placenta: spontaneous To Pathology: No   Prenatal Labs:  Blood type/Rh O pos  Antibody screen neg  Rubella Non-Immune  Varicella Immune  RPR NR  HBsAg Neg  HIV NR  GC neg  Chlamydia neg  Genetic screening negative  1 hour GTT 117  3 hour GTT    GBS Pos    Magnesium Sulfate received: No BMZ received: No Rhophylac:was  not indicated MMR: was given Varivax vaccine given: was not indicated T-DaP:Given prenatally Flu: Declined prenatally  Transfusion:No  Physical exam  Vitals:   07/14/24 1944 07/14/24 2317 07/15/24 0345 07/15/24 0732  BP: 121/82 109/66 111/77 109/76  Pulse: 100 82 81 79  Resp: 20 20 20 18   Temp: 98.3 F (36.8 C) 97.7 F (36.5 C) 97.9 F (36.6 C) 98.4 F (36.9 C)  TempSrc: Oral Oral Oral Oral  SpO2: 100% 100% 100% 100%  Weight:      Height:       General: alert, cooperative, and no distress Lochia: appropriate Uterine Fundus: firm Perineum:minimal edema/repair well approximated Incision: None DVT Evaluation: No evidence of DVT seen on physical exam.  Labs: Lab Results  Component Value Date   WBC 10.0 07/15/2024   HGB 9.0 (L) 07/15/2024   HCT 27.3 (L) 07/15/2024   MCV 87.5 07/15/2024   PLT 166 07/15/2024      Latest Ref Rng & Units 09/23/2022   10:35 PM  CMP  Glucose 70 - 99 mg/dL 853   BUN 6 - 20 mg/dL 10   Creatinine 9.55 - 1.00 mg/dL 9.32   Sodium 864 - 854 mmol/L 136   Potassium 3.5 - 5.1 mmol/L 2.7   Chloride 98 - 111 mmol/L 104   CO2 22 - 32 mmol/L 22   Calcium  8.9 - 10.3 mg/dL 9.0   Total Protein 6.5 - 8.1 g/dL 6.9   Total Bilirubin 0.3 - 1.2 mg/dL 0.5   Alkaline Phos 38 - 126 U/L 54  AST 15 - 41 U/L 26   ALT 0 - 44 U/L 18    Edinburgh Score:    07/14/2024   10:54 PM  Edinburgh Postnatal Depression Scale Screening Tool  I have been able to laugh and see the funny side of things. 0  I have looked forward with enjoyment to things. 0  I have blamed myself unnecessarily when things went wrong. 1  I have been anxious or worried for no good reason. 0  I have felt scared or panicky for no good reason. 0  Things have been getting on top of me. 0  I have been so unhappy that I have had difficulty sleeping. 0  I have felt sad or miserable. 0  I have been so unhappy that I have been crying. 0  The thought of harming myself has occurred to me. 0   Edinburgh Postnatal Depression Scale Total 1    Risk assessment for postpartum VTE and prophylactic treatment: Very high risk factors: None High risk factors: None Moderate risk factors: None  Postpartum VTE prophylaxis with LMWH not indicated  After visit meds:  Allergies as of 07/15/2024   No Known Allergies      Medication List     STOP taking these medications    aspirin EC 81 MG tablet   ferrous sulfate  325 (65 FE) MG EC tablet Replaced by: ferrous sulfate  325 (65 FE) MG tablet       TAKE these medications    acetaminophen  325 MG tablet Commonly known as: Tylenol  Take 2 tablets (650 mg total) by mouth every 4 (four) hours as needed (for pain scale < 4).   calcium  carbonate 500 MG chewable tablet Commonly known as: TUMS - dosed in mg elemental calcium  Chew 2 tablets (400 mg of elemental calcium  total) by mouth every 4 (four) hours as needed for indigestion or heartburn.   ferrous sulfate  325 (65 FE) MG tablet Take 1 tablet (325 mg total) by mouth every other day. Replaces: ferrous sulfate  325 (65 FE) MG EC tablet   multivitamin-prenatal 27-0.8 MG Tabs tablet Take 1 tablet by mouth daily at 12 noon.       Discharge home in stable condition Infant Feeding: Bottle Infant Disposition:home with mother Discharge instruction: per After Visit Summary and Postpartum booklet. Activity: Advance as tolerated. Pelvic rest for 6 weeks.  Diet: routine diet Anticipated Birth Control: TBD Postpartum Appointment:6 weeks Additional Postpartum F/U: none Future Appointments:No future appointments. Follow up Visit:  Follow-up Information     Myron Nest, CNM. Schedule an appointment as soon as possible for a visit in 6 week(s).   Specialty: Certified Nurse Midwife Contact information: [address] [place] [address] [phone]                 Plan:  [name] was discharged to home in good condition. Follow-up appointment as  directed.    Signed: Jenifer E Anahit Klumb 07/15/2024 11:59 AM

## 2024-07-14 NOTE — Progress Notes (Signed)
 Labor Progress Note  Cathy [name] is a 34 y.o. H3E8878 at [redacted]w[redacted]d by ultrasound admitted for induction of labor due to h/o IUFD.  Subjective: Pt felt ready to push, she began pushing around 1305 but needed a break after about 30 min of pushing.    Objective: BP 125/81   Pulse (!) 119   Temp 98.6 F (37 C) (Oral)   Resp 18   Ht 5' 5 (1.651 m)   Wt 78.5 kg   LMP 09/21/2023   SpO2 98%   BMI 28.80 kg/m   Fetal Assessment: FHT:  FHR: 125 bpm, variability: moderate,  accelerations:  Present,  decelerations:  Present occasional lates, variables and earlies Category/reactivity:  Category I and Category II UC:   regular, every 1-3 minutes SVE:    Dilation: 10cm  Effacement: 100  Station:  +2  Consistency: soft  Position: Anterior  Membrane status: AROM at 1452 Amniotic color: White  Labs: Lab Results  Component Value Date   WBC 6.9 07/13/2024   HGB 10.5 (L) 07/13/2024   HCT 31.7 (L) 07/13/2024   MCV 88.3 07/13/2024   PLT 205 07/13/2024    Assessment / Plan: Induction of labor due to h/o IUFD 0048 Pitocin  restarted 0053 GBS abx  (dose 4) 0315 3.5/  /-2 0422 IUPC   4/90/-2  0455 GBS abx  (dose 5) 0640 6/90/0 0805 7/  /+1 0946 GBS abx  (dose 6) 1127  10/ 1201 9.5/100/+2 1306 10/ / +2 1349 GBS abx  (dose 7) Pitocin  currently at 20mU  Labor: Progressing normally Preeclampsia:  125/81 Fetal Wellbeing:  Category I and Category II Pain Control:  Epidural I/D:  Afebrile, GBS pos, AROM x 23hrs Anticipated MOD:  NSVD  Jenifer E Ryon Layton, CNM 07/14/2024, 2:10 PM

## 2024-07-14 NOTE — Progress Notes (Signed)
 Labor Progress Note   ASSESSMENT/PLAN   Cathy [name] 34 y.o. [id number] at [redacted]w[redacted]d admited for IOL for hx IUFD.  FWB:  - Fetal well being assessed: Category 2, but moderate variability and accels are reassuring against acidosis  GBS: - GBS: positive - Prophylaxis: Adequate, continue PCN q 4h  LABOR: - Now in early labor, doing well. - Pain Management: epidural in place - Contractions are frequent, but suspect not intense. Pt consents to IUPC placement for more accurate assessment of uterine activity. This will facilitate more efficient pitocin  titration.  - Anticipate SVD   Principal Problem:   History of prior pregnancy with IUGR newborn     SUBJECTIVE/OBJECTIVE   SUBJECTIVE:  Comfortable with epidural. Complained of some pelvic pressure.    OBJECTIVE: Vital Signs: Patient Vitals for the past 12 hrs:  BP Temp Temp src Pulse Resp SpO2  07/14/24 0422 124/75 -- -- 85 -- 98 %  07/14/24 0415 -- -- -- -- -- 98 %  07/14/24 0323 (!) 112/58 -- -- (!) 101 -- --  07/14/24 0320 -- -- -- -- -- 98 %  07/14/24 0313 -- 98.6 F (37 C) Oral -- -- --  07/14/24 0225 (!) 102/53 -- -- (!) 106 -- 97 %  07/14/24 0125 -- -- -- -- -- 98 %  07/14/24 0124 123/72 -- -- (!) 111 -- --  07/14/24 0054 -- 98.6 F (37 C) Oral -- 14 --  07/14/24 0025 -- -- -- -- -- 98 %  07/14/24 0023 115/62 -- -- (!) 112 -- --  07/13/24 2325 -- -- -- -- -- 98 %  07/13/24 2323 134/72 -- -- (!) 102 -- --  07/13/24 2258 -- 98.3 F (36.8 C) Oral -- -- --  07/13/24 2225 -- -- -- -- -- 99 %  07/13/24 2223 122/67 -- -- (!) 104 -- --  07/13/24 2155 -- -- -- -- -- 98 %  07/13/24 2154 120/72 -- -- (!) 124 -- --  07/13/24 2140 124/72 -- -- 100 -- 99 %  07/13/24 2125 -- -- -- -- -- 99 %  07/13/24 2123 (!) 131/100 -- -- (!) 110 -- --  07/13/24 2121 111/67 -- -- 94 -- --  07/13/24 2120 -- -- -- -- -- 98 %  07/13/24 2108 136/76 -- -- (!) 120 -- 100 %  07/13/24 2105 -- -- -- -- -- 100 %  07/13/24 2103 129/74 -- -- 96  -- --  07/13/24 2059 122/89 -- -- 100 -- 100 %  07/13/24 2057 -- -- -- -- -- 100 %  07/13/24 2056 134/85 98.4 F (36.9 C) Oral 91 -- --  07/13/24 1910 131/80 98.6 F (37 C) Oral (!) 105 16 --  07/13/24 1659 113/63 98 F (36.7 C) Axillary 99 17 --    Last SVE:  Dilation: 4 (07/14/24 0422) Effacement (%): 90 (07/14/24 0422) Cervical Position: Posterior (07/14/24 0315) Station: -2 (07/14/24 0422) Presentation: Vertex (07/14/24 0422) Exam by:: GORMAN Lakes, RN (07/14/24 0422)  Membranes Sac Identifier: Sac 1 Membrane Status: AROM Rupture Date: 07/13/24 Rupture Time: 1452 Color: Clear Odor: Normal Amount: Small  FHR:   - Mode: External  - Baseline Rate (A): 150 bpm (FHT)  - Variability: Moderate  - Characteristics (ie - accels, decels): Accelerations: 15 x 15, Decelerations: Variable  UTERINE ACTIVITY:   - Mode: Toco  - Contraction Frequency (min): 1.5-4 minutes  Pitocin : 10mu

## 2024-07-15 LAB — CBC
HCT: 27.3 % — ABNORMAL LOW (ref 36.0–46.0)
Hemoglobin: 9 g/dL — ABNORMAL LOW (ref 12.0–15.0)
MCH: 28.8 pg (ref 26.0–34.0)
MCHC: 33 g/dL (ref 30.0–36.0)
MCV: 87.5 fL (ref 80.0–100.0)
Platelets: 166 10*3/uL (ref 150–400)
RBC: 3.12 MIL/uL — ABNORMAL LOW (ref 3.87–5.11)
RDW: 13.7 % (ref 11.5–15.5)
WBC: 10 10*3/uL (ref 4.0–10.5)
nRBC: 0 % (ref 0.0–0.2)

## 2024-07-15 MED ORDER — FERROUS SULFATE 325 (65 FE) MG PO TABS: 325.0000 mg | ORAL_TABLET | ORAL | Status: AC

## 2024-07-15 NOTE — Anesthesia Postprocedure Evaluation (Signed)
"   Anesthesia Post Note  Patient: Cathy [name]  Procedure(s) Performed: AN AD HOC LABOR EPIDURAL  Patient location during evaluation: Mother Baby Anesthesia Type: Epidural Level of consciousness: awake and alert Pain management: pain level controlled Vital Signs Assessment: post-procedure vital signs reviewed and stable Respiratory status: spontaneous breathing, nonlabored ventilation and respiratory function stable Cardiovascular status: stable Postop Assessment: no headache, no backache and epidural receding Anesthetic complications: no   No notable events documented.   Last Vitals:  Vitals:   07/15/24 0345 07/15/24 0732  BP: 111/77 109/76  Pulse: 81 79  Resp: 20 18  Temp: 36.6 C 36.9 C  SpO2: 100% 100%    Last Pain:  Vitals:   07/15/24 0800  TempSrc:   PainSc: 2                  [name]      "

## 2024-07-15 NOTE — Progress Notes (Signed)
 Patient discharged home with family.  Discharge instructions, when to follow up, and medications reviewed with patient.  Patient verbalized understanding. Patient will be escorted out by staff.

## 2024-07-15 NOTE — Discharge Instructions (Addendum)
 Call office if you have any of the following:  -Persistent headache or visual changes (possible high blood pressure) -Fever >101.0 F or chills (possible infection) -Breast concerns (engorgement, mastitis) -Excessive vaginal bleeding (soaking through more than one pad in 1 hr x 2 hr) -Incision drainage/redness/increased pain/warmth at site (possible infection)  -Leg pain or redness (possible blood clot) -Depression/anxiety increased symptoms 2 weeks after delivery  Activity & Hygiene: -Do not lift > 10 lbs for 6 weeks.  -No intercourse or tampons for 6 weeks.  -No swimming pools, hot tubs or tub baths- showers only for 6 weeks **No driving for 1-2 weeks or while taking pain medication after c-section  -It is normal to bleed for up to 6 weeks. You should not soak through more than 1 pad in 1 hour x 2 hours.  Breastfeeding: -Continue prenatal vitamin.  -Increase calories and fluids.  -Your milk will come in, in the next couple of days (right now it is colostrum).  -You may have a slight fever when your milk comes in, but it should go away on its own.   -If it does not, and rises above 101 F please call the doctor.  -You will also feel achy and your breasts will be firm. They will also start to leak.  *For breastfeeding concerns, the lactation consultant can be reached at [phone]  Not Breastfeeding: -Avoid breast stimulation and wear supportive bra -ICE helps decrease inflammation and pain -Express milk for comfort by hand, do not empty breast  Postpartum blues: -feelings of happy one minute and sad another minute are normal for the first few weeks. -if it gets worse please let your doctor know. It is very common!!            Some PCP options in [place] area- not a comprehensive list  [facility]- [phone] [facility]- [phone] [facility]- [phone] [facility]- [phone] [facility]- [phone] Nichole Molly- [phone]  or  [facility] Physician Referral Line [phone]

## 2024-07-15 NOTE — Clinical Social Work Maternal (Signed)
 " CLINICAL SOCIAL WORK MATERNAL/CHILD NOTE  Patient Details  Name: Laurali Goddard MRN: [id number] Date of Birth: [birth year]  Date:  07/15/2024  Clinical Social Worker Initiating Note:  Corrie Ruts Date/Time: Initiated:  07/15/24/1030     Child's Name:  Kieran Borrow   Biological Parents:  Mother   Need for Interpreter:  None   Reason for Referral:  Late or No Prenatal Care     Address:  [address] [place] [address]    Phone number:  [phone] (home)     Additional phone number:   Household Members/Support Persons (HM/SP):   Household Member/Support Person 1, Household Member/Support Person 2   HM/SP Name Relationship DOB or Age  HM/SP -1 Klyn Duck MOB 34  HM/SP -2 Ari Johnson Daughter of patinet 6  HM/SP -3        HM/SP -4        HM/SP -5        HM/SP -6        HM/SP -7        HM/SP -8          Natural Supports (not living in the home):  Immediate Family, Friends   Radiographer, Therapeutic Supports:     Employment:     Type of Work:     Education:      Homebound arranged:    Architect:      Other Resources:  Food Stamps     Cultural/Religious Considerations Which May Impact Care:    Strengths:  Ability to meet basic needs  , Compliance with medical plan  , Home prepared for child  , Pediatrician chosen   Psychotropic Medications:         Pediatrician:    [facility]  Pediatrician List:   [facility]      Pediatrician Fax Number:    Risk Factors/Current Problems:  Other (Comment) (Late prenatal care)   Cognitive State:  Alert     Mood/Affect:  Calm  , Interested     CSW Assessment:  Chart reviewed. Please note due to inclement weather consult was done via telephone call. Received a consult for late prenatal care. I was able to speak with the patient. I introduced myself, my role, and reason for consult.  The  patient reports that she is doing well after delivery. The patient reports that she has outside support from her family and friends. The patient report that she lives in the home with her 33 year old daughter. The patient reports that she receives food stamps.   The patient reports that she has all the baby items. The patient reports that the baby will go to [facility] in [place] for the baby medical needs.   The patient reports that she doesn't have a PCP but she goes to [facility] her in [place]. The patient was accepting of PCP resources added to her AVS. SW inquired about any barriers that the prevented her from attending medical appointments while pregnant. The patient reports that there were no barriers. The patient reports it was just me.   SW reviewed information about Post partum depression, Sudden infant death syndrome, and mental health resources. The patient verbalized understanding.   The patient reports that her brother will assist her during D/C.   CSW Plan/Description:  Sudden Infant Death Syndrome (SIDS) Education,  Other Information/Referral to [facility] JINNY Ruts, LCSW 07/15/2024, 10:40 AM  "
